# Patient Record
Sex: Female | Born: 1950
Health system: Southern US, Community
[De-identification: ages and names within clinical notes are randomized; demographics above are authoritative.]

## PROBLEM LIST (undated history)

## (undated) DIAGNOSIS — R011 Cardiac murmur, unspecified: Secondary | ICD-10-CM

## (undated) DIAGNOSIS — I10 Essential (primary) hypertension: Secondary | ICD-10-CM

## (undated) DIAGNOSIS — R600 Localized edema: Secondary | ICD-10-CM

## (undated) DIAGNOSIS — H269 Unspecified cataract: Secondary | ICD-10-CM

## (undated) DIAGNOSIS — E785 Hyperlipidemia, unspecified: Secondary | ICD-10-CM

## (undated) DIAGNOSIS — E119 Type 2 diabetes mellitus without complications: Secondary | ICD-10-CM

## (undated) HISTORY — PX: DILATION AND CURETTAGE OF UTERUS: SHX78

## (undated) HISTORY — DX: Unspecified cataract: H26.9

## (undated) HISTORY — DX: Localized edema: R60.0

## (undated) HISTORY — DX: Hyperlipidemia, unspecified: E78.5

## (undated) HISTORY — PX: COLONOSCOPY: SHX174

## (undated) HISTORY — PX: CATARACT EXTRACTION, BILATERAL: SHX1313

## (undated) HISTORY — DX: Cardiac murmur, unspecified: R01.1

---

## 1998-01-05 ENCOUNTER — Other Ambulatory Visit: Admission: RE | Admit: 1998-01-05 | Discharge: 1998-01-05 | Payer: Self-pay | Admitting: Obstetrics & Gynecology

## 1998-05-04 ENCOUNTER — Ambulatory Visit (HOSPITAL_COMMUNITY): Admission: RE | Admit: 1998-05-04 | Discharge: 1998-05-04 | Payer: Self-pay | Admitting: *Deleted

## 1999-01-18 ENCOUNTER — Other Ambulatory Visit: Admission: RE | Admit: 1999-01-18 | Discharge: 1999-01-18 | Payer: Self-pay | Admitting: Obstetrics & Gynecology

## 1999-08-02 ENCOUNTER — Ambulatory Visit (HOSPITAL_COMMUNITY): Admission: RE | Admit: 1999-08-02 | Discharge: 1999-08-02 | Payer: Self-pay | Admitting: *Deleted

## 2000-01-24 ENCOUNTER — Other Ambulatory Visit: Admission: RE | Admit: 2000-01-24 | Discharge: 2000-01-24 | Payer: Self-pay | Admitting: Obstetrics & Gynecology

## 2001-02-12 ENCOUNTER — Other Ambulatory Visit: Admission: RE | Admit: 2001-02-12 | Discharge: 2001-02-12 | Payer: Self-pay | Admitting: Obstetrics & Gynecology

## 2001-02-19 ENCOUNTER — Encounter: Payer: Self-pay | Admitting: Obstetrics & Gynecology

## 2001-02-19 ENCOUNTER — Ambulatory Visit (HOSPITAL_COMMUNITY): Admission: RE | Admit: 2001-02-19 | Discharge: 2001-02-19 | Payer: Self-pay | Admitting: Obstetrics & Gynecology

## 2002-02-18 ENCOUNTER — Other Ambulatory Visit: Admission: RE | Admit: 2002-02-18 | Discharge: 2002-02-18 | Payer: Self-pay | Admitting: Obstetrics & Gynecology

## 2004-01-19 ENCOUNTER — Other Ambulatory Visit: Admission: RE | Admit: 2004-01-19 | Discharge: 2004-01-19 | Payer: Self-pay | Admitting: Obstetrics & Gynecology

## 2005-02-19 ENCOUNTER — Other Ambulatory Visit: Admission: RE | Admit: 2005-02-19 | Discharge: 2005-02-19 | Payer: Self-pay | Admitting: Obstetrics & Gynecology

## 2006-05-03 ENCOUNTER — Ambulatory Visit: Payer: Self-pay | Admitting: Internal Medicine

## 2006-05-15 ENCOUNTER — Ambulatory Visit: Payer: Self-pay | Admitting: Internal Medicine

## 2013-10-15 ENCOUNTER — Other Ambulatory Visit: Payer: Self-pay | Admitting: Internal Medicine

## 2013-10-15 DIAGNOSIS — N289 Disorder of kidney and ureter, unspecified: Secondary | ICD-10-CM

## 2013-10-16 ENCOUNTER — Ambulatory Visit
Admission: RE | Admit: 2013-10-16 | Discharge: 2013-10-16 | Disposition: A | Discharge status: Home / Self Care | Payer: BC Managed Care – PPO | Source: Ambulatory Visit | Attending: Internal Medicine | Admitting: Internal Medicine

## 2013-10-16 DIAGNOSIS — N289 Disorder of kidney and ureter, unspecified: Secondary | ICD-10-CM

## 2015-08-31 MED FILL — FELODIPINE ER 10 MG TABLET: 10 | 30 days supply | Qty: 30 | Fill #10

## 2015-08-31 MED FILL — POTASSIUM CL ER 20 MEQ TAB: 20 | 60 days supply | Qty: 60 | Fill #6

## 2015-08-31 MED FILL — NOVOLOG MIX 70/30 VIAL: (70-30) 100 | 30 days supply | Qty: 40 | Fill #8

## 2015-08-31 MED FILL — GLYBURIDE-METFORMIN 2.5-500: 2.5-500 | 30 days supply | Qty: 120 | Fill #1

## 2015-09-26 MED FILL — NOVOLOG MIX 70/30 VIAL: (70-30) 100 | 30 days supply | Qty: 40 | Fill #9

## 2015-09-26 MED FILL — FELODIPINE ER 10 MG TABLET: 10 | 30 days supply | Qty: 30 | Fill #11

## 2015-09-26 MED FILL — GLYBURIDE-METFORMIN 2.5-500: 2.5-500 | 30 days supply | Qty: 120 | Fill #2

## 2015-09-26 MED FILL — LISINOPRIL 40 MG TABLET: 40 | 90 days supply | Qty: 90 | Fill #3

## 2015-10-28 MED FILL — NOVOLOG MIX 70/30 VIAL: (70-30) 100 | 30 days supply | Qty: 40 | Fill #0

## 2015-10-28 MED FILL — GLYBURIDE-METFORMIN 2.5-500: 2.5-500 | 30 days supply | Qty: 120 | Fill #3

## 2015-10-28 MED FILL — ONE TOUCH ULTRA TEST STRIPS: 90 days supply | Qty: 300 | Fill #1

## 2015-10-28 MED FILL — FELODIPINE ER 10 MG TABLET: 10 | 30 days supply | Qty: 30 | Fill #0

## 2015-11-28 DIAGNOSIS — E119 Type 2 diabetes mellitus without complications: Secondary | ICD-10-CM | POA: Diagnosis not present

## 2015-11-28 DIAGNOSIS — E784 Other hyperlipidemia: Secondary | ICD-10-CM | POA: Diagnosis not present

## 2015-11-28 DIAGNOSIS — I10 Essential (primary) hypertension: Secondary | ICD-10-CM | POA: Diagnosis not present

## 2015-11-29 MED FILL — FELODIPINE ER 10 MG TABLET: 10 | 30 days supply | Qty: 30 | Fill #1

## 2015-11-29 MED FILL — GLYBURIDE-METFORMIN 2.5-500: 2.5-500 | 30 days supply | Qty: 120 | Fill #4

## 2015-12-06 DIAGNOSIS — G5600 Carpal tunnel syndrome, unspecified upper limb: Secondary | ICD-10-CM | POA: Diagnosis not present

## 2015-12-06 DIAGNOSIS — Z0000 Encounter for general adult medical examination without abnormal findings: Secondary | ICD-10-CM | POA: Diagnosis not present

## 2015-12-06 DIAGNOSIS — K219 Gastro-esophageal reflux disease without esophagitis: Secondary | ICD-10-CM | POA: Diagnosis not present

## 2015-12-06 DIAGNOSIS — E1122 Type 2 diabetes mellitus with diabetic chronic kidney disease: Secondary | ICD-10-CM | POA: Diagnosis not present

## 2015-12-06 DIAGNOSIS — E784 Other hyperlipidemia: Secondary | ICD-10-CM | POA: Diagnosis not present

## 2015-12-06 DIAGNOSIS — M5380 Other specified dorsopathies, site unspecified: Secondary | ICD-10-CM | POA: Diagnosis not present

## 2015-12-06 DIAGNOSIS — N183 Chronic kidney disease, stage 3 (moderate): Secondary | ICD-10-CM | POA: Diagnosis not present

## 2015-12-06 DIAGNOSIS — I10 Essential (primary) hypertension: Secondary | ICD-10-CM | POA: Diagnosis not present

## 2015-12-06 DIAGNOSIS — R011 Cardiac murmur, unspecified: Secondary | ICD-10-CM | POA: Diagnosis not present

## 2015-12-06 MED FILL — CHANTIX STARTING MONTH BOX: 0.5 MG X 11 | 28 days supply | Qty: 53 | Fill #0

## 2015-12-07 DIAGNOSIS — Z1212 Encounter for screening for malignant neoplasm of rectum: Secondary | ICD-10-CM | POA: Diagnosis not present

## 2015-12-08 MED FILL — traMADol HCL 50 MG TABS: 50 | 17 days supply | Qty: 50 | Fill #0

## 2015-12-09 MED FILL — NOVOLOG MIX 70/30 VIAL: (70-30) 100 | 30 days supply | Qty: 40 | Fill #1

## 2015-12-09 MED FILL — ULTICARE SYR 1 ML 30GX5/16: 30G X 5/16" | 90 days supply | Qty: 200 | Fill #0

## 2015-12-26 MED FILL — FUROSEMIDE 80 MG TABLET: 80 | 90 days supply | Qty: 45 | Fill #0

## 2015-12-26 MED FILL — FELODIPINE ER 10 MG TABLET: 10 | 30 days supply | Qty: 30 | Fill #2

## 2015-12-26 MED FILL — LISINOPRIL 40 MG TABLET: 40 | 90 days supply | Qty: 90 | Fill #0

## 2015-12-26 MED FILL — SIMVASTATIN 80 MG TABLET: 80 | 90 days supply | Qty: 90 | Fill #0

## 2016-01-05 MED FILL — NOVOLOG MIX 70/30 VIAL: (70-30) 100 | 30 days supply | Qty: 40 | Fill #2

## 2016-01-05 MED FILL — CHANTIX 1 MG CONT MONTH BOX: 1 | 28 days supply | Qty: 56 | Fill #0

## 2016-01-26 MED FILL — FELODIPINE ER 10 MG TABLET: 10 | 30 days supply | Qty: 30 | Fill #3

## 2016-02-06 MED FILL — NOVOLOG MIX 70/30 VIAL: (70-30) 100 | 30 days supply | Qty: 40 | Fill #3

## 2016-02-06 MED FILL — GLYBURIDE-METFORMIN 2.5-500: 2.5-500 | 30 days supply | Qty: 120 | Fill #5

## 2016-02-06 MED FILL — CHANTIX 1 MG CONT MONTH BOX: 1 | 28 days supply | Qty: 56 | Fill #1

## 2016-02-06 MED FILL — KLOR-CON M20 TABLET: 20 | 60 days supply | Qty: 30 | Fill #0

## 2016-02-06 MED FILL — ONE TOUCH ULTRA TEST STRIPS: 90 days supply | Qty: 300 | Fill #2

## 2016-02-27 MED FILL — FELODIPINE ER 10 MG TABLET: 10 | 30 days supply | Qty: 30 | Fill #4

## 2016-03-07 MED FILL — CHANTIX 1 MG CONT MONTH BOX: 1 | 28 days supply | Qty: 56 | Fill #2

## 2016-03-07 MED FILL — NOVOLOG MIX 70/30 VIAL: (70-30) 100 | 30 days supply | Qty: 40 | Fill #4

## 2016-03-07 MED FILL — ULTICARE SYR 1 ML 30GX5/16: 30G X 5/16" | 90 days supply | Qty: 200 | Fill #1

## 2016-03-26 MED FILL — LISINOPRIL 40 MG TABLET: 40 | 90 days supply | Qty: 90 | Fill #1

## 2016-03-26 MED FILL — GLYBURIDE-METFORMIN 2.5-500: 2.5-500 | 30 days supply | Qty: 120 | Fill #6

## 2016-03-26 MED FILL — SIMVASTATIN 80 MG TABLET: 80 | 90 days supply | Qty: 90 | Fill #1

## 2016-03-26 MED FILL — FUROSEMIDE 80 MG TABLET: 80 | 90 days supply | Qty: 45 | Fill #1

## 2016-03-26 MED FILL — FELODIPINE ER 10 MG TABLET: 10 | 30 days supply | Qty: 30 | Fill #5

## 2016-03-30 MED FILL — CHANTIX 1 MG CONT MONTH BOX: 1 | 28 days supply | Qty: 56 | Fill #3

## 2016-04-16 DIAGNOSIS — I10 Essential (primary) hypertension: Secondary | ICD-10-CM | POA: Diagnosis not present

## 2016-04-16 DIAGNOSIS — R011 Cardiac murmur, unspecified: Secondary | ICD-10-CM | POA: Diagnosis not present

## 2016-04-16 DIAGNOSIS — E1122 Type 2 diabetes mellitus with diabetic chronic kidney disease: Secondary | ICD-10-CM | POA: Diagnosis not present

## 2016-04-16 DIAGNOSIS — Z6829 Body mass index (BMI) 29.0-29.9, adult: Secondary | ICD-10-CM | POA: Diagnosis not present

## 2016-04-16 DIAGNOSIS — N183 Chronic kidney disease, stage 3 (moderate): Secondary | ICD-10-CM | POA: Diagnosis not present

## 2016-04-16 DIAGNOSIS — F17200 Nicotine dependence, unspecified, uncomplicated: Secondary | ICD-10-CM | POA: Diagnosis not present

## 2016-04-16 DIAGNOSIS — E784 Other hyperlipidemia: Secondary | ICD-10-CM | POA: Diagnosis not present

## 2016-04-27 MED FILL — FELODIPINE ER 10 MG TABLET: 10 | 30 days supply | Qty: 30 | Fill #6

## 2016-04-27 MED FILL — GLYBURIDE-METFORMIN 2.5-500: 2.5-500 | 30 days supply | Qty: 120 | Fill #7

## 2016-04-27 MED FILL — NOVOLOG MIX 70/30 VIAL: (70-30) 100 | 30 days supply | Qty: 40 | Fill #5

## 2016-05-01 DIAGNOSIS — E119 Type 2 diabetes mellitus without complications: Secondary | ICD-10-CM | POA: Diagnosis not present

## 2016-05-01 DIAGNOSIS — H40013 Open angle with borderline findings, low risk, bilateral: Secondary | ICD-10-CM | POA: Diagnosis not present

## 2016-05-01 DIAGNOSIS — H26491 Other secondary cataract, right eye: Secondary | ICD-10-CM | POA: Diagnosis not present

## 2016-05-04 MED FILL — CHANTIX 1 MG CONT MONTH BOX: 1 | 28 days supply | Qty: 56 | Fill #4

## 2016-05-25 MED FILL — FELODIPINE ER 10 MG TABLET: 10 | 30 days supply | Qty: 30 | Fill #7

## 2016-05-25 MED FILL — GLYBURIDE-METFORMIN 2.5-500: 2.5-500 | 30 days supply | Qty: 120 | Fill #0

## 2016-06-06 ENCOUNTER — Encounter: Payer: Self-pay | Admitting: Internal Medicine

## 2016-06-15 MED FILL — ULTICARE SYR 1 ML 30GX5/16": 30G X 5/16" | 90 days supply | Qty: 200 | Fill #2

## 2016-06-15 MED FILL — TRIAMCINOLONE 0.1% CREAM: 0.1 | 20 days supply | Qty: 80 | Fill #1

## 2016-06-15 MED FILL — ONE TOUCH ULTRA TEST STRIPS: 90 days supply | Qty: 300 | Fill #3

## 2016-06-15 MED FILL — NOVOLOG MIX 70/30 VIAL: (70-30) 100 | 30 days supply | Qty: 40 | Fill #6

## 2016-06-15 MED FILL — ULTICARE SYR 1 ML 30GX5/16: 30G X 5/16" | 90 days supply | Qty: 200 | Fill #2

## 2016-06-25 MED FILL — FUROSEMIDE 80 MG TABLET: 80 | 90 days supply | Qty: 45 | Fill #2

## 2016-06-25 MED FILL — LISINOPRIL 40 MG TABLET: 40 | 90 days supply | Qty: 90 | Fill #2

## 2016-06-25 MED FILL — KLOR-CON M20 TABLET: 20 | 60 days supply | Qty: 30 | Fill #1

## 2016-06-25 MED FILL — FELODIPINE ER 10 MG TABLET: 10 | 30 days supply | Qty: 30 | Fill #8

## 2016-06-25 MED FILL — GLYBURIDE-METFORMIN 2.5-500: 2.5-500 | 30 days supply | Qty: 120 | Fill #1

## 2016-07-25 MED FILL — NOVOLOG MIX 70/30 VIAL: (70-30) 100 | 30 days supply | Qty: 40 | Fill #7

## 2016-07-25 MED FILL — SIMVASTATIN 80 MG TABLET: 80 | 90 days supply | Qty: 90 | Fill #2

## 2016-07-25 MED FILL — FELODIPINE ER 10 MG TABLET: 10 | 30 days supply | Qty: 30 | Fill #9

## 2016-07-25 MED FILL — GLYBURIDE-METFORMIN 2.5-500: 2.5-500 | 30 days supply | Qty: 120 | Fill #2

## 2016-08-15 DIAGNOSIS — E784 Other hyperlipidemia: Secondary | ICD-10-CM | POA: Diagnosis not present

## 2016-08-15 DIAGNOSIS — I10 Essential (primary) hypertension: Secondary | ICD-10-CM | POA: Diagnosis not present

## 2016-08-15 DIAGNOSIS — N183 Chronic kidney disease, stage 3 (moderate): Secondary | ICD-10-CM | POA: Diagnosis not present

## 2016-08-15 DIAGNOSIS — E1122 Type 2 diabetes mellitus with diabetic chronic kidney disease: Secondary | ICD-10-CM | POA: Diagnosis not present

## 2016-08-15 DIAGNOSIS — F17200 Nicotine dependence, unspecified, uncomplicated: Secondary | ICD-10-CM | POA: Diagnosis not present

## 2016-08-28 MED FILL — FELODIPINE ER 10 MG TABLET: 10 | 30 days supply | Qty: 30 | Fill #10

## 2016-09-21 MED FILL — FELODIPINE ER 10 MG TABLET: 10 | 30 days supply | Qty: 30 | Fill #11

## 2016-09-21 MED FILL — LISINOPRIL 40 MG TABLET: 40 | 90 days supply | Qty: 90 | Fill #3

## 2016-09-21 MED FILL — KLOR-CON M20 TABLET: 20 | 30 days supply | Qty: 30 | Fill #2

## 2016-09-21 MED FILL — ULTICARE SYR 1 ML 30GX5/16: 30G X 5/16" | 90 days supply | Qty: 200 | Fill #3

## 2016-09-21 MED FILL — ULTICARE SYR 1 ML 30GX5/16": 30G X 5/16" | 90 days supply | Qty: 200 | Fill #3

## 2016-09-21 MED FILL — FUROSEMIDE 80 MG TABLET: 80 | 90 days supply | Qty: 45 | Fill #0

## 2016-10-25 MED FILL — SIMVASTATIN 80 MG TABLET: 80 | 90 days supply | Qty: 90 | Fill #3

## 2016-10-25 MED FILL — FELODIPINE ER 10 MG TABLET: 10 | 30 days supply | Qty: 30 | Fill #0

## 2016-10-25 MED FILL — GLYBURIDE-METFORMIN 2.5-500: 2.5-500 | 30 days supply | Qty: 120 | Fill #3

## 2016-10-26 MED FILL — FREESTYLE LANCETS: 90 days supply | Qty: 300 | Fill #0

## 2016-10-26 MED FILL — FREESTYLE LITE METER: 30 days supply | Qty: 1 | Fill #0

## 2016-10-26 MED FILL — FREESTYLE LITE TEST STRIP: 90 days supply | Qty: 300 | Fill #0

## 2016-11-23 MED FILL — FELODIPINE ER 10 MG TABLET: 10 | 30 days supply | Qty: 30 | Fill #1

## 2016-11-23 MED FILL — KLOR-CON M20 TABLET: 20 | 30 days supply | Qty: 30 | Fill #3

## 2016-11-23 MED FILL — GLYBURIDE-METFORMIN 2.5-500: 2.5-500 | 30 days supply | Qty: 120 | Fill #4

## 2016-12-05 ENCOUNTER — Encounter: Payer: Self-pay | Admitting: Internal Medicine

## 2016-12-21 MED FILL — FELODIPINE ER 10 MG TABLET: 10 | 30 days supply | Qty: 30 | Fill #2

## 2016-12-21 MED FILL — POTASSIUM CL ER 20 MEQ TABL: 20 | 30 days supply | Qty: 30 | Fill #4

## 2016-12-21 MED FILL — GLYBURIDE-METFORMIN 2.5-500: 2.5-500 | 30 days supply | Qty: 120 | Fill #5

## 2016-12-21 MED FILL — LISINOPRIL 40 MG TABLET: 40 | 90 days supply | Qty: 90 | Fill #0

## 2016-12-21 MED FILL — FUROSEMIDE 80 MG TABLET: 80 | 90 days supply | Qty: 45 | Fill #1

## 2017-01-02 DIAGNOSIS — Z01419 Encounter for gynecological examination (general) (routine) without abnormal findings: Secondary | ICD-10-CM | POA: Diagnosis not present

## 2017-01-02 DIAGNOSIS — Z1231 Encounter for screening mammogram for malignant neoplasm of breast: Secondary | ICD-10-CM | POA: Diagnosis not present

## 2017-01-02 DIAGNOSIS — Z124 Encounter for screening for malignant neoplasm of cervix: Secondary | ICD-10-CM | POA: Diagnosis not present

## 2017-01-02 DIAGNOSIS — Z6839 Body mass index (BMI) 39.0-39.9, adult: Secondary | ICD-10-CM | POA: Diagnosis not present

## 2017-01-24 MED FILL — GLYBURIDE-METFORMIN 2.5-500: 2.5-500 | 30 days supply | Qty: 120 | Fill #6

## 2017-01-24 MED FILL — FREESTYLE LITE TEST STRIP: 90 days supply | Qty: 300 | Fill #1

## 2017-01-24 MED FILL — FELODIPINE ER 10 MG TABLET: 10 | 30 days supply | Qty: 30 | Fill #3

## 2017-01-30 ENCOUNTER — Ambulatory Visit (AMBULATORY_SURGERY_CENTER): Payer: Self-pay

## 2017-01-30 ENCOUNTER — Telehealth: Payer: Self-pay | Admitting: Internal Medicine

## 2017-01-30 VITALS — Ht 63.5 in | Wt 184.6 lb

## 2017-01-30 DIAGNOSIS — Z1211 Encounter for screening for malignant neoplasm of colon: Secondary | ICD-10-CM

## 2017-01-30 MED ORDER — BISACODYL 5 MG PO TBEC: 5.0000 mg | DELAYED_RELEASE_TABLET | Freq: Once | ORAL | 0 refills | Status: AC

## 2017-01-30 MED ORDER — POLYETHYLENE GLYCOL 3350 17 GM/SCOOP PO POWD: 1.0000 | Freq: Once | ORAL | 0 refills | Status: AC

## 2017-01-30 MED FILL — POLYETHYLENE GLYCOL 3350: 1 days supply | Qty: 255 | Fill #0

## 2017-01-30 NOTE — Telephone Encounter (Signed)
Called pharmacy and clarified pt wanted OTC prep Jakwan Sally/PV

## 2017-01-30 NOTE — Progress Notes (Signed)
No allergies to eggs or soy No past problems with anesthesia No diet meds No home oxygen  Declined emmi 

## 2017-02-13 ENCOUNTER — Encounter: Payer: Self-pay | Admitting: Internal Medicine

## 2017-02-13 ENCOUNTER — Ambulatory Visit (AMBULATORY_SURGERY_CENTER): Payer: 59 | Admitting: Internal Medicine

## 2017-02-13 VITALS — BP 152/70 | HR 50 | Temp 96.6°F | Resp 18 | Ht 63.5 in | Wt 184.0 lb

## 2017-02-13 DIAGNOSIS — Z1212 Encounter for screening for malignant neoplasm of rectum: Secondary | ICD-10-CM

## 2017-02-13 DIAGNOSIS — Z1211 Encounter for screening for malignant neoplasm of colon: Secondary | ICD-10-CM

## 2017-02-13 MED ORDER — SODIUM CHLORIDE 0.9 % IV SOLN: 500.0000 mL | INTRAVENOUS | Status: AC

## 2017-02-13 NOTE — Patient Instructions (Addendum)
No polyps or cancer seen. Next routine colonoscopy or other screening test in 10 years - 2028  You do have diverticulosis - thickened muscle rings and pouches in the colon wall. Please read the handout about this condition.  I appreciate the opportunity to care for you. Robin Duran E. Bobby Ragan, MD, FACG  YOU HAD AN ENDOSCOPIC PROCEDURE TODAY AT THE  ENDOSCOPY CENTER:   Refer to the procedure report that was given to you for any specific questions about what was found during the examination.  If the procedure report does not answer your questions, please call your gastroenterologist to clarify.  If you requested that your care partner not be given the details of your procedure findings, then the procedure report has been included in a sealed envelope for you to review at your convenience later.  YOU SHOULD EXPECT: Some feelings of bloating in the abdomen. Passage of more gas than usual.  Walking can help get rid of the air that was put into your GI tract during the procedure and reduce the bloating. If you had a lower endoscopy (such as a colonoscopy or flexible sigmoidoscopy) you may notice spotting of blood in your stool or on the toilet paper. If you underwent a bowel prep for your procedure, you may not have a normal bowel movement for a few days.  Please Note:  You might notice some irritation and congestion in your nose or some drainage.  This is from the oxygen used during your procedure.  There is no need for concern and it should clear up in a day or so.  SYMPTOMS TO REPORT IMMEDIATELY:   Following lower endoscopy (colonoscopy or flexible sigmoidoscopy):  Excessive amounts of blood in the stool  Significant tenderness or worsening of abdominal pains  Swelling of the abdomen that is new, acute  Fever of 100F or higher  For urgent or emergent issues, a gastroenterologist can be reached at any hour by calling [phone].   DIET:  We do recommend a small meal at first, but  then you may proceed to your regular diet.  Drink plenty of fluids but you should avoid alcoholic beverages for 24 hours.  MEDICATIONS: Continue present medications.  Please see handouts given to you by your recovery nurse.  ACTIVITY:  You should plan to take it easy for the rest of today and you should NOT DRIVE or use heavy machinery until tomorrow (because of the sedation medicines used during the test).    FOLLOW UP: Our staff will call the number listed on your records the next business day following your procedure to check on you and address any questions or concerns that you may have regarding the information given to you following your procedure. If we do not reach you, we will leave a message.  However, if you are feeling well and you are not experiencing any problems, there is no need to return our call.  We will assume that you have returned to your regular daily activities without incident.  If any biopsies were taken you will be contacted by phone or by letter within the next 1-3 weeks.  Please call us at [phone] if you have not heard about the biopsies in 3 weeks.   Thank you for allowing us to provide for your healthcare needs today.  SIGNATURES/CONFIDENTIALITY: You and/or your care partner have signed paperwork which will be entered into your electronic medical record.  These signatures attest to the fact that that the information above on  your After Visit Summary has been reviewed and is understood.  Full responsibility of the confidentiality of this discharge information lies with you and/or your care-partner. 

## 2017-02-13 NOTE — Progress Notes (Signed)
Alert and oriented x3, pleased with MAC, report to RN Judson Roch

## 2017-02-13 NOTE — Op Note (Signed)
[facility] Patient Name: Robin Duran Procedure Date: 02/13/2017 9:00 AM MRN: [id number] Endoscopist: Iva Boop , MD Age: 66 Referring MD:  Date of Birth: 1951-05-09 Gender: Female Account #: [id number] Procedure:                Colonoscopy Indications:              Screening for colorectal malignant neoplasm Medicines:                Propofol per Anesthesia, Monitored Anesthesia Care Procedure:                Pre-Anesthesia Assessment:                           - Prior to the procedure, a History and Physical                            was performed, and patient medications and                            allergies were reviewed. The patient's tolerance of                            previous anesthesia was also reviewed. The risks                            and benefits of the procedure and the sedation                            options and risks were discussed with the patient.                            All questions were answered, and informed consent                            was obtained. Prior Anticoagulants: The patient has                            taken no previous anticoagulant or antiplatelet                            agents. ASA Grade Assessment: II - A patient with                            mild systemic disease. After reviewing the risks                            and benefits, the patient was deemed in                            satisfactory condition to undergo the procedure.                           After obtaining informed consent, the colonoscope  was passed under direct vision. Throughout the                            procedure, the patient's blood pressure, pulse, and                            oxygen saturations were monitored continuously. The                            Colonoscope was introduced through the anus and                            advanced to the the cecum, identified by   appendiceal orifice and ileocecal valve. The                            colonoscopy was performed without difficulty. The                            patient tolerated the procedure well. The quality                            of the bowel preparation was good. The ileocecal                            valve, appendiceal orifice, and rectum were                            photographed. Scope In: 9:06:45 AM Scope Out: 9:22:08 AM Scope Withdrawal Time: 0 hours 11 minutes 58 seconds  Total Procedure Duration: 0 hours 15 minutes 23 seconds  Findings:                 The perianal and digital rectal examinations were                            normal.                           Multiple small and large-mouthed diverticula were                            found in the sigmoid colon. There was no evidence                            of diverticular bleeding.                           The exam was otherwise without abnormality on                            direct and retroflexion views. Complications:            No immediate complications. Estimated Blood Loss:     Estimated blood loss: none. Impression:               - Diverticulosis in the  sigmoid colon. There was no                            evidence of diverticular bleeding.                           - The examination was otherwise normal on direct                            and retroflexion views.                           - No specimens collected. Recommendation:           - Patient has a contact number available for                            emergencies. The signs and symptoms of potential                            delayed complications were discussed with the                            patient. Return to normal activities tomorrow.                            Written discharge instructions were provided to the                            patient.                           - Resume previous diet.                           - Continue present  medications.                           - Repeat colonoscopy in 10 years for screening                            purposes. Iva Booparl E Savayah Waltrip, MD 02/13/2017 9:33:34 AM This report has been signed electronically.

## 2017-02-14 ENCOUNTER — Telehealth: Payer: Self-pay | Admitting: *Deleted

## 2017-02-14 NOTE — Telephone Encounter (Signed)
  Follow up Call-  Call back number 02/13/2017  Post procedure Call Back phone  # [phone]  Permission to leave phone message Yes  Some recent data might be hidden     Patient questions:  Do you have a fever, pain , or abdominal swelling? No. Pain Score  0 *  Have you tolerated food without any problems? Yes.    Have you been able to return to your normal activities? Yes.    Do you have any questions about your discharge instructions: Diet   No. Medications  No. Follow up visit  No.  Do you have questions or concerns about your Care? No.  Actions: * If pain score is 4 or above: No action needed, pain <4.

## 2017-02-18 DIAGNOSIS — E1122 Type 2 diabetes mellitus with diabetic chronic kidney disease: Secondary | ICD-10-CM | POA: Diagnosis not present

## 2017-02-18 DIAGNOSIS — I10 Essential (primary) hypertension: Secondary | ICD-10-CM | POA: Diagnosis not present

## 2017-02-22 MED FILL — FELODIPINE ER 10 MG TABLET: 10 | 30 days supply | Qty: 30 | Fill #4

## 2017-02-22 MED FILL — GLYBURIDE-METFORMIN 2.5-500: 2.5-500 | 30 days supply | Qty: 120 | Fill #7

## 2017-02-22 MED FILL — POTASSIUM CL ER 20 MEQ TABL: 20 | 30 days supply | Qty: 30 | Fill #0

## 2017-03-04 DIAGNOSIS — Z780 Asymptomatic menopausal state: Secondary | ICD-10-CM | POA: Diagnosis not present

## 2017-03-04 DIAGNOSIS — K219 Gastro-esophageal reflux disease without esophagitis: Secondary | ICD-10-CM | POA: Diagnosis not present

## 2017-03-04 DIAGNOSIS — I10 Essential (primary) hypertension: Secondary | ICD-10-CM | POA: Diagnosis not present

## 2017-03-04 DIAGNOSIS — R011 Cardiac murmur, unspecified: Secondary | ICD-10-CM | POA: Diagnosis not present

## 2017-03-04 DIAGNOSIS — F17200 Nicotine dependence, unspecified, uncomplicated: Secondary | ICD-10-CM | POA: Diagnosis not present

## 2017-03-04 DIAGNOSIS — Z23 Encounter for immunization: Secondary | ICD-10-CM | POA: Diagnosis not present

## 2017-03-04 DIAGNOSIS — E784 Other hyperlipidemia: Secondary | ICD-10-CM | POA: Diagnosis not present

## 2017-03-04 DIAGNOSIS — Z0000 Encounter for general adult medical examination without abnormal findings: Secondary | ICD-10-CM | POA: Diagnosis not present

## 2017-03-04 DIAGNOSIS — E1122 Type 2 diabetes mellitus with diabetic chronic kidney disease: Secondary | ICD-10-CM | POA: Diagnosis not present

## 2017-03-04 DIAGNOSIS — N183 Chronic kidney disease, stage 3 (moderate): Secondary | ICD-10-CM | POA: Diagnosis not present

## 2017-03-04 DIAGNOSIS — G5600 Carpal tunnel syndrome, unspecified upper limb: Secondary | ICD-10-CM | POA: Diagnosis not present

## 2017-03-04 DIAGNOSIS — Z1389 Encounter for screening for other disorder: Secondary | ICD-10-CM | POA: Diagnosis not present

## 2017-03-18 MED FILL — FUROSEMIDE 80 MG TABLET: 80 | 90 days supply | Qty: 45 | Fill #2

## 2017-03-18 MED FILL — FELODIPINE ER 10 MG TABLET: 10 | 30 days supply | Qty: 30 | Fill #5

## 2017-03-18 MED FILL — LISINOPRIL 40 MG TAB: 40 | 90 days supply | Qty: 90 | Fill #1

## 2017-03-18 MED FILL — SIMVASTATIN 80 MG TABLET: 80 | 30 days supply | Qty: 30 | Fill #0

## 2017-04-24 MED FILL — FREESTYLE LITE TEST STRIP: 90 days supply | Qty: 300 | Fill #2

## 2017-04-24 MED FILL — GLYBURIDE-METFORMIN 2.5-500: 2.5-500 | 30 days supply | Qty: 120 | Fill #8

## 2017-04-24 MED FILL — FELODIPINE ER 10 MG TABLET: 10 | 30 days supply | Qty: 30 | Fill #6

## 2017-04-24 MED FILL — POTASSIUM CL ER 20 MEQ TABL: 20 | 30 days supply | Qty: 30 | Fill #1

## 2017-05-21 MED FILL — GLYBURIDE-METFORMIN 2.5-500: 2.5-500 | 30 days supply | Qty: 120 | Fill #9

## 2017-05-21 MED FILL — POTASSIUM CL ER 20 MEQ TABL: 20 | 30 days supply | Qty: 30 | Fill #2

## 2017-05-22 MED FILL — FELODIPINE ER 10 MG TABLET: 10 | 30 days supply | Qty: 30 | Fill #7

## 2017-06-18 MED FILL — SIMVASTATIN 80 MG TABLET: 80 | 30 days supply | Qty: 30 | Fill #1

## 2017-06-18 MED FILL — LISINOPRIL 40 MG TABLET: 40 | 90 days supply | Qty: 90 | Fill #2

## 2017-06-18 MED FILL — POTASSIUM CL ER 20 MEQ TABL: 20 | 30 days supply | Qty: 30 | Fill #3

## 2017-06-19 MED FILL — GLYBURIDE-METFORMIN 2.5-500: 2.5-500 | 30 days supply | Qty: 120 | Fill #0

## 2017-06-19 MED FILL — FUROSEMIDE 80 MG TABLET: 80 | 90 days supply | Qty: 45 | Fill #0

## 2017-06-24 DIAGNOSIS — I10 Essential (primary) hypertension: Secondary | ICD-10-CM | POA: Diagnosis not present

## 2017-06-24 DIAGNOSIS — E1122 Type 2 diabetes mellitus with diabetic chronic kidney disease: Secondary | ICD-10-CM | POA: Diagnosis not present

## 2017-06-24 DIAGNOSIS — F17200 Nicotine dependence, unspecified, uncomplicated: Secondary | ICD-10-CM | POA: Diagnosis not present

## 2017-06-24 DIAGNOSIS — Z6831 Body mass index (BMI) 31.0-31.9, adult: Secondary | ICD-10-CM | POA: Diagnosis not present

## 2017-06-24 DIAGNOSIS — D649 Anemia, unspecified: Secondary | ICD-10-CM | POA: Diagnosis not present

## 2017-06-24 DIAGNOSIS — N183 Chronic kidney disease, stage 3 (moderate): Secondary | ICD-10-CM | POA: Diagnosis not present

## 2017-06-25 MED FILL — FELODIPINE ER 10 MG TABLET: 10 | 30 days supply | Qty: 30 | Fill #8

## 2017-06-28 MED FILL — TOUJEO SOLOSTAR 300 UNITS/M: 300 | 90 days supply | Qty: 27 | Fill #0

## 2017-07-02 MED FILL — UNIFINE PENTIPS 8MM 31G: 31G X 8 MM | 90 days supply | Qty: 100 | Fill #0

## 2017-07-24 MED FILL — FELODIPINE ER 10 MG TABLET: 10 | 30 days supply | Qty: 30 | Fill #9

## 2017-07-24 MED FILL — POTASSIUM CL ER 20 MEQ TABL: 20 | 30 days supply | Qty: 30 | Fill #4

## 2017-07-24 MED FILL — SIMVASTATIN 80 MG TABLET: 80 | 30 days supply | Qty: 30 | Fill #2

## 2017-07-24 MED FILL — GLYBURIDE-METFORMIN 2.5-500: 2.5-500 | 30 days supply | Qty: 120 | Fill #1

## 2017-08-21 MED FILL — FREESTYLE LITE TEST STRIP: 90 days supply | Qty: 300 | Fill #3

## 2017-08-21 MED FILL — FELODIPINE ER 10 MG TABLET: 10 | 30 days supply | Qty: 30 | Fill #10

## 2017-08-21 MED FILL — SIMVASTATIN 80 MG TABLET: 80 | 90 days supply | Qty: 90 | Fill #3

## 2017-08-21 MED FILL — GLYBURIDE-METFORMIN 2.5-500: 2.5-500 | 30 days supply | Qty: 120 | Fill #2

## 2017-08-21 MED FILL — POTASSIUM CL ER 20 MEQ TABL: 20 | 30 days supply | Qty: 30 | Fill #5

## 2017-09-11 MED FILL — LISINOPRIL 40 MG TABLET: 40 | 90 days supply | Qty: 90 | Fill #3

## 2017-09-11 MED FILL — FUROSEMIDE 80 MG TABLET: 80 | 90 days supply | Qty: 45 | Fill #1

## 2017-09-24 MED FILL — TOUJEO SOLOSTAR 300 UNITS/M: 300 | 90 days supply | Qty: 27 | Fill #1

## 2017-09-24 MED FILL — FELODIPINE ER 10 MG TABLET: 10 | 30 days supply | Qty: 30 | Fill #11

## 2017-09-24 MED FILL — GLYBURIDE-METFORMIN 2.5-500: 2.5-500 | 30 days supply | Qty: 120 | Fill #3

## 2017-09-24 MED FILL — POTASSIUM CL ER 20 MEQ TABL: 20 | 30 days supply | Qty: 30 | Fill #6

## 2017-10-21 MED FILL — FELODIPINE ER 10 MG TABLET: 10 | 30 days supply | Qty: 30 | Fill #0

## 2017-10-21 MED FILL — GLYBURIDE-METFORMIN 2.5-500: 2.5-500 | 30 days supply | Qty: 120 | Fill #4

## 2017-10-21 MED FILL — POTASSIUM CL ER 20 MEQ TABL: 20 | 30 days supply | Qty: 30 | Fill #7

## 2017-11-18 DIAGNOSIS — Z6831 Body mass index (BMI) 31.0-31.9, adult: Secondary | ICD-10-CM | POA: Diagnosis not present

## 2017-11-18 DIAGNOSIS — E1122 Type 2 diabetes mellitus with diabetic chronic kidney disease: Secondary | ICD-10-CM | POA: Diagnosis not present

## 2017-11-18 DIAGNOSIS — Z1389 Encounter for screening for other disorder: Secondary | ICD-10-CM | POA: Diagnosis not present

## 2017-11-18 DIAGNOSIS — I10 Essential (primary) hypertension: Secondary | ICD-10-CM | POA: Diagnosis not present

## 2017-11-18 DIAGNOSIS — N183 Chronic kidney disease, stage 3 (moderate): Secondary | ICD-10-CM | POA: Diagnosis not present

## 2017-11-18 DIAGNOSIS — M79671 Pain in right foot: Secondary | ICD-10-CM | POA: Diagnosis not present

## 2017-11-18 DIAGNOSIS — E1165 Type 2 diabetes mellitus with hyperglycemia: Secondary | ICD-10-CM | POA: Diagnosis not present

## 2017-11-18 MED FILL — HUMALOG 100 UNITS/ML KWIKPE: 100 | 30 days supply | Qty: 9 | Fill #0

## 2017-11-19 MED FILL — FELODIPINE ER 10 MG TABLET: 10 | 30 days supply | Qty: 30 | Fill #1

## 2017-12-10 MED FILL — GLYBURIDE-METFORMIN 2.5-500: 2.5-500 | 30 days supply | Qty: 120 | Fill #5

## 2017-12-10 MED FILL — POTASSIUM CL ER 20 MEQ TABL: 20 | 30 days supply | Qty: 30 | Fill #8

## 2017-12-10 MED FILL — LISINOPRIL 40 MG TABLET: 40 | 30 days supply | Qty: 30 | Fill #0

## 2017-12-10 MED FILL — FUROSEMIDE 80 MG TABLET: 80 | 90 days supply | Qty: 45 | Fill #2

## 2017-12-11 MED FILL — FREESTYLE LITE TEST STRIP: 30 days supply | Qty: 100 | Fill #0

## 2017-12-17 DIAGNOSIS — E119 Type 2 diabetes mellitus without complications: Secondary | ICD-10-CM | POA: Diagnosis not present

## 2017-12-26 MED FILL — HUMALOG 100 UNITS/ML KWIKPE: 100 | 30 days supply | Qty: 9 | Fill #1

## 2017-12-26 MED FILL — FELODIPINE ER 10 MG TABLET: 10 | 30 days supply | Qty: 30 | Fill #2

## 2017-12-26 MED FILL — TOUJEO SOLOSTAR 300 UNITS/M: 300 | 90 days supply | Qty: 27 | Fill #2

## 2018-01-14 MED FILL — FREESTYLE LITE TEST STRIP: 30 days supply | Qty: 100 | Fill #0

## 2018-01-14 MED FILL — POTASSIUM CL ER 20 MEQ TABL: 20 | 30 days supply | Qty: 30 | Fill #9

## 2018-01-14 MED FILL — GLYBURIDE-METFORMIN 2.5-500: 2.5-500 | 30 days supply | Qty: 120 | Fill #6

## 2018-01-14 MED FILL — LISINOPRIL 40 MG TABLET: 40 | 30 days supply | Qty: 30 | Fill #0

## 2018-01-17 DIAGNOSIS — E1165 Type 2 diabetes mellitus with hyperglycemia: Secondary | ICD-10-CM | POA: Diagnosis not present

## 2018-01-17 DIAGNOSIS — N183 Chronic kidney disease, stage 3 (moderate): Secondary | ICD-10-CM | POA: Diagnosis not present

## 2018-01-17 DIAGNOSIS — I10 Essential (primary) hypertension: Secondary | ICD-10-CM | POA: Diagnosis not present

## 2018-01-23 MED FILL — FELODIPINE ER 10 MG TABLET: 10 | 30 days supply | Qty: 30 | Fill #3

## 2018-02-12 MED FILL — POTASSIUM CL ER 20 MEQ TABL: 20 | 30 days supply | Qty: 30 | Fill #10

## 2018-02-12 MED FILL — GLYBURIDE-METFORMIN 2.5-500: 2.5-500 | 30 days supply | Qty: 120 | Fill #7

## 2018-02-12 MED FILL — LISINOPRIL 40 MG TABLET: 40 | 30 days supply | Qty: 30 | Fill #1

## 2018-02-21 MED FILL — FELODIPINE ER 10 MG TABLET: 10 | 30 days supply | Qty: 30 | Fill #4

## 2018-03-06 MED FILL — FUROSEMIDE 80 MG TABLET: 80 | 90 days supply | Qty: 45 | Fill #0

## 2018-03-06 MED FILL — FREESTYLE LANCETS: 90 days supply | Qty: 300 | Fill #0

## 2018-03-06 MED FILL — FREESTYLE LITE TEST STRIP: 33 days supply | Qty: 100 | Fill #0

## 2018-03-06 MED FILL — HUMALOG 100 UNITS/ML KWIKPE: 100 | 30 days supply | Qty: 9 | Fill #2

## 2018-03-10 MED FILL — POTASSIUM CL ER 20 MEQ TAB: 20 | 30 days supply | Qty: 30 | Fill #0

## 2018-03-13 MED FILL — LISINOPRIL 40 MG TABLET: 40 | 30 days supply | Qty: 30 | Fill #2

## 2018-03-13 MED FILL — GLYBURIDE-METFORMIN 2.5-500: 2.5-500 | 30 days supply | Qty: 120 | Fill #8

## 2018-03-24 DIAGNOSIS — E1122 Type 2 diabetes mellitus with diabetic chronic kidney disease: Secondary | ICD-10-CM | POA: Diagnosis not present

## 2018-03-24 DIAGNOSIS — R82998 Other abnormal findings in urine: Secondary | ICD-10-CM | POA: Diagnosis not present

## 2018-03-24 DIAGNOSIS — I10 Essential (primary) hypertension: Secondary | ICD-10-CM | POA: Diagnosis not present

## 2018-03-25 MED FILL — FELODIPINE ER 10 MG TABLET: 10 | 30 days supply | Qty: 30 | Fill #5

## 2018-03-31 DIAGNOSIS — N183 Chronic kidney disease, stage 3 (moderate): Secondary | ICD-10-CM | POA: Diagnosis not present

## 2018-03-31 DIAGNOSIS — Z1389 Encounter for screening for other disorder: Secondary | ICD-10-CM | POA: Diagnosis not present

## 2018-03-31 DIAGNOSIS — I10 Essential (primary) hypertension: Secondary | ICD-10-CM | POA: Diagnosis not present

## 2018-03-31 DIAGNOSIS — Z0000 Encounter for general adult medical examination without abnormal findings: Secondary | ICD-10-CM | POA: Diagnosis not present

## 2018-03-31 DIAGNOSIS — M5380 Other specified dorsopathies, site unspecified: Secondary | ICD-10-CM | POA: Diagnosis not present

## 2018-03-31 DIAGNOSIS — R011 Cardiac murmur, unspecified: Secondary | ICD-10-CM | POA: Diagnosis not present

## 2018-03-31 DIAGNOSIS — K219 Gastro-esophageal reflux disease without esophagitis: Secondary | ICD-10-CM | POA: Diagnosis not present

## 2018-03-31 DIAGNOSIS — D649 Anemia, unspecified: Secondary | ICD-10-CM | POA: Diagnosis not present

## 2018-03-31 DIAGNOSIS — E1122 Type 2 diabetes mellitus with diabetic chronic kidney disease: Secondary | ICD-10-CM | POA: Diagnosis not present

## 2018-03-31 DIAGNOSIS — E7849 Other hyperlipidemia: Secondary | ICD-10-CM | POA: Diagnosis not present

## 2018-03-31 MED FILL — HUMALOG MIX 75-25 KWIKPEN: (75-25) 100 | 28 days supply | Qty: 30 | Fill #0

## 2018-04-09 MED FILL — LISINOPRIL 40 MG TABLET: 40 | 30 days supply | Qty: 30 | Fill #3

## 2018-04-24 MED FILL — POTASSIUM CL ER 20 MEQ TAB: 20 | 30 days supply | Qty: 30 | Fill #1

## 2018-04-24 MED FILL — FELODIPINE ER 10 MG TABLET: 10 | 30 days supply | Qty: 30 | Fill #6

## 2018-04-24 MED FILL — GLYBURIDE-METFORMIN 2.5-500: 2.5-500 | 30 days supply | Qty: 120 | Fill #9

## 2018-05-06 MED FILL — FREESTYLE LITE TEST STRIP: 33 days supply | Qty: 100 | Fill #1

## 2018-05-06 MED FILL — HUMALOG MIX 75-25 KWIKPEN: (75-25) 100 | 30 days supply | Qty: 39 | Fill #1

## 2018-05-15 MED FILL — LISINOPRIL 40 MG TABLET: 40 | 30 days supply | Qty: 30 | Fill #4

## 2018-05-22 MED FILL — GLYBURIDE-METFORMIN 2.5-500: 2.5-500 | 30 days supply | Qty: 120 | Fill #10

## 2018-05-22 MED FILL — FELODIPINE ER 10 MG TABLET: 10 | 90 days supply | Qty: 90 | Fill #0

## 2018-06-11 MED FILL — FREESTYLE LITE TEST STRIP: 33 days supply | Qty: 100 | Fill #2

## 2018-06-11 MED FILL — FUROSEMIDE 80 MG TABLET: 80 | 90 days supply | Qty: 45 | Fill #1

## 2018-06-11 MED FILL — HUMALOG MIX 75-25 KWIKPEN: (75-25) 100 | 30 days supply | Qty: 39 | Fill #2

## 2018-06-11 MED FILL — LISINOPRIL 40 MG TABLET: 40 | 90 days supply | Qty: 90 | Fill #0

## 2018-06-11 MED FILL — POTASSIUM CL ER 20 MEQ TAB: 20 | 30 days supply | Qty: 30 | Fill #2

## 2018-06-24 MED FILL — CEPHALEXIN 500 MG CAPSULE: 500 | 7 days supply | Qty: 21 | Fill #0

## 2018-07-03 DIAGNOSIS — E1165 Type 2 diabetes mellitus with hyperglycemia: Secondary | ICD-10-CM | POA: Diagnosis not present

## 2018-07-03 DIAGNOSIS — Z6830 Body mass index (BMI) 30.0-30.9, adult: Secondary | ICD-10-CM | POA: Diagnosis not present

## 2018-07-03 DIAGNOSIS — L299 Pruritus, unspecified: Secondary | ICD-10-CM | POA: Diagnosis not present

## 2018-07-03 DIAGNOSIS — E1122 Type 2 diabetes mellitus with diabetic chronic kidney disease: Secondary | ICD-10-CM | POA: Diagnosis not present

## 2018-07-03 DIAGNOSIS — R21 Rash and other nonspecific skin eruption: Secondary | ICD-10-CM | POA: Diagnosis not present

## 2018-07-23 MED FILL — GLYBURIDE-METFORMIN 2.5-500: 2.5-500 | 90 days supply | Qty: 360 | Fill #0

## 2018-07-23 MED FILL — HUMALOG MIX 75-25 KWIKPEN: (75-25) 100 | 30 days supply | Qty: 39 | Fill #3

## 2018-07-23 MED FILL — POTASSIUM CHLORIDE CRYS ER: 20 | 30 days supply | Qty: 30 | Fill #3

## 2018-07-23 MED FILL — FREESTYLE LITE TEST STRIP: 33 days supply | Qty: 100 | Fill #3

## 2018-08-08 DIAGNOSIS — F17200 Nicotine dependence, unspecified, uncomplicated: Secondary | ICD-10-CM | POA: Diagnosis not present

## 2018-08-08 DIAGNOSIS — Z6830 Body mass index (BMI) 30.0-30.9, adult: Secondary | ICD-10-CM | POA: Diagnosis not present

## 2018-08-08 DIAGNOSIS — N183 Chronic kidney disease, stage 3 (moderate): Secondary | ICD-10-CM | POA: Diagnosis not present

## 2018-08-08 DIAGNOSIS — D649 Anemia, unspecified: Secondary | ICD-10-CM | POA: Diagnosis not present

## 2018-08-08 DIAGNOSIS — E1122 Type 2 diabetes mellitus with diabetic chronic kidney disease: Secondary | ICD-10-CM | POA: Diagnosis not present

## 2018-08-08 DIAGNOSIS — I10 Essential (primary) hypertension: Secondary | ICD-10-CM | POA: Diagnosis not present

## 2018-08-08 DIAGNOSIS — M1990 Unspecified osteoarthritis, unspecified site: Secondary | ICD-10-CM | POA: Diagnosis not present

## 2018-08-14 MED FILL — DICLOFENAC SODIUM 1 % GEL: 1 | 7 days supply | Qty: 100 | Fill #0

## 2018-08-21 MED FILL — FELODIPINE ER 10 MG TABLET: 10 | 90 days supply | Qty: 90 | Fill #1

## 2018-08-21 MED FILL — HUMALOG MIX 75-25 KWIKPEN: (75-25) 100 | 30 days supply | Qty: 39 | Fill #4

## 2018-09-09 MED FILL — POTASSIUM CHLORIDE CRYS ER: 20 | 30 days supply | Qty: 30 | Fill #4

## 2018-09-09 MED FILL — FUROSEMIDE 80 MG TABLET: 80 | 90 days supply | Qty: 45 | Fill #0

## 2018-09-09 MED FILL — FREESTYLE LITE TEST STRIP: 33 days supply | Qty: 100 | Fill #0

## 2018-09-09 MED FILL — LISINOPRIL 40 MG TABLET: 40 | 90 days supply | Qty: 90 | Fill #1

## 2018-10-07 MED FILL — HUMALOG MIX 75-25 KWIKPEN: (75-25) 100 | 30 days supply | Qty: 39 | Fill #5

## 2018-10-07 MED FILL — POTASSIUM CHLORIDE CRYS ER: 20 | 30 days supply | Qty: 30 | Fill #0

## 2018-10-07 MED FILL — UNIFINE PENTIPS 8MM 31G: 31G X 8 MM | 90 days supply | Qty: 300 | Fill #0

## 2018-10-07 MED FILL — FREESTYLE LITE TEST STRIP: 33 days supply | Qty: 100 | Fill #1

## 2018-11-12 MED FILL — HUMALOG MIX 75-25 KWIKPEN: (75-25) 100 | 30 days supply | Qty: 39 | Fill #6 | Status: TO

## 2018-11-12 MED FILL — TRIAMCINOLONE 0.1% CREAM: 0.1 | 10 days supply | Qty: 80 | Fill #0

## 2018-11-12 MED FILL — FREESTYLE LITE TEST STRIP: 33 days supply | Qty: 100 | Fill #2 | Status: TO

## 2018-11-12 MED FILL — FELODIPINE ER 10 MG TABLET: 10 | 90 days supply | Qty: 90 | Fill #0

## 2018-11-12 MED FILL — GLYBURIDE-METFORMIN 2.5-500: 2.5-500 | 90 days supply | Qty: 360 | Fill #1

## 2018-11-12 MED FILL — POTASSIUM CHLORIDE CRYS ER: 20 | 30 days supply | Qty: 30 | Fill #1 | Status: TO

## 2018-12-09 DIAGNOSIS — F17200 Nicotine dependence, unspecified, uncomplicated: Secondary | ICD-10-CM | POA: Diagnosis not present

## 2018-12-09 DIAGNOSIS — Z1331 Encounter for screening for depression: Secondary | ICD-10-CM | POA: Diagnosis not present

## 2018-12-09 DIAGNOSIS — D649 Anemia, unspecified: Secondary | ICD-10-CM | POA: Diagnosis not present

## 2018-12-09 DIAGNOSIS — N183 Chronic kidney disease, stage 3 (moderate): Secondary | ICD-10-CM | POA: Diagnosis not present

## 2018-12-09 DIAGNOSIS — E1165 Type 2 diabetes mellitus with hyperglycemia: Secondary | ICD-10-CM | POA: Diagnosis not present

## 2018-12-09 DIAGNOSIS — I129 Hypertensive chronic kidney disease with stage 1 through stage 4 chronic kidney disease, or unspecified chronic kidney disease: Secondary | ICD-10-CM | POA: Diagnosis not present

## 2018-12-09 DIAGNOSIS — E785 Hyperlipidemia, unspecified: Secondary | ICD-10-CM | POA: Diagnosis not present

## 2018-12-09 DIAGNOSIS — R252 Cramp and spasm: Secondary | ICD-10-CM | POA: Diagnosis not present

## 2018-12-09 DIAGNOSIS — M1990 Unspecified osteoarthritis, unspecified site: Secondary | ICD-10-CM | POA: Diagnosis not present

## 2018-12-09 DIAGNOSIS — R21 Rash and other nonspecific skin eruption: Secondary | ICD-10-CM | POA: Diagnosis not present

## 2018-12-09 MED FILL — HUMALOG MIX 75-25 KWIKPEN: (75-25) 100 | 30 days supply | Qty: 39 | Fill #0

## 2018-12-09 MED FILL — FUROSEMIDE 80 MG TAB: 80 | 90 days supply | Qty: 45 | Fill #0

## 2018-12-09 MED FILL — LISINOPRIL 40 MG TABLET: 40 | 90 days supply | Qty: 90 | Fill #0

## 2018-12-09 MED FILL — predniSONE 20 MG TABS: 20 | 7 days supply | Qty: 8 | Fill #0

## 2018-12-09 MED FILL — FREESTYLE LITE TEST STRIP: 33 days supply | Qty: 100 | Fill #0

## 2018-12-09 MED FILL — POTASSIUM CHLORIDE CRYS ER: 20 | 30 days supply | Qty: 30 | Fill #0

## 2019-01-13 MED FILL — HUMALOG MIX 75-25 KWIKPEN: (75-25) 100 | 30 days supply | Qty: 39 | Fill #1

## 2019-01-13 MED FILL — POTASSIUM CHLORIDE CRYS ER: 20 | 30 days supply | Qty: 30 | Fill #1

## 2019-01-13 MED FILL — FREESTYLE LITE TEST STRIP: 90 days supply | Qty: 300 | Fill #0

## 2019-02-11 MED FILL — UNIFINE PENTIPS 8MM 31G: 31G X 8 MM | 90 days supply | Qty: 200 | Fill #1

## 2019-02-11 MED FILL — HUMALOG MIX 75-25 KWIKPEN: (75-25) 100 | 30 days supply | Qty: 39 | Fill #0

## 2019-02-11 MED FILL — POTASSIUM CHLORIDE CRYS ER: 20 | 30 days supply | Qty: 30 | Fill #0

## 2019-02-11 MED FILL — FELODIPINE ER 10 MG TABLET: 10 | 90 days supply | Qty: 90 | Fill #1

## 2019-02-11 MED FILL — GLYBURIDE-METFORMIN 2.5-500: 2.5-500 | 90 days supply | Qty: 360 | Fill #0

## 2019-03-11 MED FILL — HUMALOG MIX 75-25 KWIKPEN: (75-25) 100 | 30 days supply | Qty: 39 | Fill #1

## 2019-03-11 MED FILL — FUROSEMIDE 80 MG TABLET: 80 | 90 days supply | Qty: 45 | Fill #0

## 2019-03-11 MED FILL — LISINOPRIL 40 MG TABLET: 40 | 90 days supply | Qty: 90 | Fill #0

## 2019-05-13 DIAGNOSIS — I10 Essential (primary) hypertension: Secondary | ICD-10-CM | POA: Diagnosis not present

## 2019-05-13 DIAGNOSIS — R82998 Other abnormal findings in urine: Secondary | ICD-10-CM | POA: Diagnosis not present

## 2019-05-13 DIAGNOSIS — E7849 Other hyperlipidemia: Secondary | ICD-10-CM | POA: Diagnosis not present

## 2019-05-13 DIAGNOSIS — Z0000 Encounter for general adult medical examination without abnormal findings: Secondary | ICD-10-CM | POA: Diagnosis not present

## 2019-05-13 DIAGNOSIS — E1122 Type 2 diabetes mellitus with diabetic chronic kidney disease: Secondary | ICD-10-CM | POA: Diagnosis not present

## 2019-05-13 MED FILL — HUMALOG MIX 75-25 KWIKPEN: (75-25) 100 | 28 days supply | Qty: 30 | Fill #0

## 2019-05-13 MED FILL — UNIFINE PENTIPS 8MM 31G: 31G X 8 MM | 90 days supply | Qty: 200 | Fill #2

## 2019-05-13 MED FILL — FELODIPINE ER 10 MG TABLET: 10 | 90 days supply | Qty: 90 | Fill #2

## 2019-05-21 DIAGNOSIS — D649 Anemia, unspecified: Secondary | ICD-10-CM | POA: Diagnosis not present

## 2019-05-21 DIAGNOSIS — N183 Chronic kidney disease, stage 3 (moderate): Secondary | ICD-10-CM | POA: Diagnosis not present

## 2019-05-21 DIAGNOSIS — E1165 Type 2 diabetes mellitus with hyperglycemia: Secondary | ICD-10-CM | POA: Diagnosis not present

## 2019-05-21 DIAGNOSIS — Z0000 Encounter for general adult medical examination without abnormal findings: Secondary | ICD-10-CM | POA: Diagnosis not present

## 2019-05-21 DIAGNOSIS — R252 Cramp and spasm: Secondary | ICD-10-CM | POA: Diagnosis not present

## 2019-05-21 DIAGNOSIS — F17200 Nicotine dependence, unspecified, uncomplicated: Secondary | ICD-10-CM | POA: Diagnosis not present

## 2019-05-21 DIAGNOSIS — I129 Hypertensive chronic kidney disease with stage 1 through stage 4 chronic kidney disease, or unspecified chronic kidney disease: Secondary | ICD-10-CM | POA: Diagnosis not present

## 2019-05-21 DIAGNOSIS — M1990 Unspecified osteoarthritis, unspecified site: Secondary | ICD-10-CM | POA: Diagnosis not present

## 2019-05-21 DIAGNOSIS — E785 Hyperlipidemia, unspecified: Secondary | ICD-10-CM | POA: Diagnosis not present

## 2019-05-21 MED FILL — SHINGRIX 50 MCG SUS: 50 | 1 days supply | Qty: 1 | Fill #0

## 2019-06-11 MED FILL — HUMALOG MIX 75-25 KWIKPEN: (75-25) 100 | 28 days supply | Qty: 30 | Fill #1

## 2019-06-11 MED FILL — LISINOPRIL 40 MG TABLET: 40 | 90 days supply | Qty: 90 | Fill #0

## 2019-06-11 MED FILL — FUROSEMIDE 80 MG TAB: 80 | 90 days supply | Qty: 45 | Fill #1

## 2019-06-24 MED FILL — DICLOFENAC SODIUM 1 % GEL: 1 | 7 days supply | Qty: 100 | Fill #1

## 2019-06-24 MED FILL — FREESTYLE LITE TEST STRIP: 90 days supply | Qty: 300 | Fill #0

## 2019-07-21 MED FILL — HUMALOG MIX 75-25 KWIKPEN: (75-25) 100 | 28 days supply | Qty: 30 | Fill #2

## 2019-07-27 MED FILL — SHINGRIX 50 MCG SUS: 50 | 1 days supply | Qty: 1 | Fill #1

## 2019-08-12 MED FILL — FELODIPINE ER 10 MG TABLET: 10 | 90 days supply | Qty: 90 | Fill #0

## 2019-08-24 MED FILL — HUMALOG MIX 75-25 KWIKPEN: (75-25) 100 | 28 days supply | Qty: 30 | Fill #3

## 2019-09-10 DIAGNOSIS — N1831 Chronic kidney disease, stage 3a: Secondary | ICD-10-CM | POA: Diagnosis not present

## 2019-09-10 DIAGNOSIS — I129 Hypertensive chronic kidney disease with stage 1 through stage 4 chronic kidney disease, or unspecified chronic kidney disease: Secondary | ICD-10-CM | POA: Diagnosis not present

## 2019-09-10 DIAGNOSIS — E785 Hyperlipidemia, unspecified: Secondary | ICD-10-CM | POA: Diagnosis not present

## 2019-09-10 DIAGNOSIS — F430 Acute stress reaction: Secondary | ICD-10-CM | POA: Diagnosis not present

## 2019-09-10 DIAGNOSIS — E1165 Type 2 diabetes mellitus with hyperglycemia: Secondary | ICD-10-CM | POA: Diagnosis not present

## 2019-09-10 DIAGNOSIS — F17200 Nicotine dependence, unspecified, uncomplicated: Secondary | ICD-10-CM | POA: Diagnosis not present

## 2019-09-10 MED FILL — ALPRAZolam 0.5 MG TABS: 0.5 | 7 days supply | Qty: 30 | Fill #0

## 2019-09-10 MED FILL — LISINOPRIL 40 MG TABLET: 40 | 90 days supply | Qty: 90 | Fill #1

## 2019-09-10 MED FILL — FUROSEMIDE 80 MG TAB: 80 | 90 days supply | Qty: 45 | Fill #2

## 2019-09-10 MED FILL — ESCITALOPRAM 5 MG TABLET: 5 | 90 days supply | Qty: 90 | Fill #0

## 2019-10-05 MED FILL — HUMALOG MIX 75-25 KWIKPEN: (75-25) 100 | 28 days supply | Qty: 36 | Fill #4

## 2019-11-11 MED FILL — HUMALOG MIX 75-25 KWIKPEN: (75-25) 100 | 30 days supply | Qty: 39 | Fill #0

## 2019-11-11 MED FILL — FELODIPINE ER 10 MG TABLET: 10 | 90 days supply | Qty: 90 | Fill #1

## 2019-12-09 MED FILL — ESCITALOPRAM 5 MG TABLET: 5 | 90 days supply | Qty: 90 | Fill #1

## 2019-12-09 MED FILL — HUMALOG MIX 75-25 KWIKPEN: (75-25) 100 | 30 days supply | Qty: 39 | Fill #1

## 2019-12-09 MED FILL — LISINOPRIL 40 MG TABS: 40 | 90 days supply | Qty: 90 | Fill #2

## 2019-12-10 ENCOUNTER — Other Ambulatory Visit (HOSPITAL_COMMUNITY): Payer: Self-pay | Admitting: Internal Medicine

## 2019-12-10 MED FILL — FUROSEMIDE 80 MG TAB: 80 | 90 days supply | Qty: 45 | Fill #0

## 2020-01-13 MED FILL — HUMALOG MIX 75-25 KWIKPEN: (75-25) 100 | 30 days supply | Qty: 39 | Fill #2

## 2020-02-10 ENCOUNTER — Other Ambulatory Visit (HOSPITAL_COMMUNITY): Payer: Self-pay | Admitting: Endocrinology

## 2020-02-10 MED FILL — FELODIPINE ER 10 MG TABLET: 10 | 90 days supply | Qty: 90 | Fill #0

## 2020-02-10 MED FILL — HUMALOG MIX 75-25 KWIKPEN: (75-25) 100 | 30 days supply | Qty: 39 | Fill #0

## 2020-03-09 MED FILL — ESCITALOPRAM 5 MG TABLET: 5 | 90 days supply | Qty: 90 | Fill #2

## 2020-03-09 MED FILL — HUMALOG MIX 75-25 KWIKPEN: (75-25) 100 | 30 days supply | Qty: 39 | Fill #1

## 2020-03-09 MED FILL — LISINOPRIL 40 MG TABS: 40 | 90 days supply | Qty: 90 | Fill #3

## 2020-03-09 MED FILL — FUROSEMIDE 80 MG TAB: 80 | 90 days supply | Qty: 45 | Fill #1

## 2020-03-22 ENCOUNTER — Other Ambulatory Visit (HOSPITAL_COMMUNITY): Payer: Self-pay | Admitting: Internal Medicine

## 2020-03-22 MED FILL — FREESTYLE LITE TEST STRIP: 83 days supply | Qty: 250 | Fill #0

## 2020-03-22 MED FILL — SIMVASTATIN 40 MG TABLET: 40 | 90 days supply | Qty: 90 | Fill #0

## 2020-05-12 MED FILL — FELODIPINE ER 10 MG TABLET: 10 | 90 days supply | Qty: 90 | Fill #1

## 2020-05-12 MED FILL — HUMALOG MIX 75-25 KWIKPEN: (75-25) 100 | 30 days supply | Qty: 39 | Fill #2

## 2020-06-01 MED FILL — ESCITALOPRAM 5 MG TABLET: 5 | 90 days supply | Qty: 90 | Fill #3

## 2020-06-01 MED FILL — LISINOPRIL 40 MG TABS: 40 | 90 days supply | Qty: 90 | Fill #0

## 2020-06-14 DIAGNOSIS — I10 Essential (primary) hypertension: Secondary | ICD-10-CM | POA: Diagnosis not present

## 2020-06-14 DIAGNOSIS — Z0000 Encounter for general adult medical examination without abnormal findings: Secondary | ICD-10-CM | POA: Diagnosis not present

## 2020-06-14 DIAGNOSIS — E785 Hyperlipidemia, unspecified: Secondary | ICD-10-CM | POA: Diagnosis not present

## 2020-06-14 DIAGNOSIS — E1122 Type 2 diabetes mellitus with diabetic chronic kidney disease: Secondary | ICD-10-CM | POA: Diagnosis not present

## 2020-06-21 ENCOUNTER — Other Ambulatory Visit (HOSPITAL_COMMUNITY): Payer: Self-pay | Admitting: Internal Medicine

## 2020-06-21 DIAGNOSIS — Z0000 Encounter for general adult medical examination without abnormal findings: Secondary | ICD-10-CM | POA: Diagnosis not present

## 2020-06-21 DIAGNOSIS — E785 Hyperlipidemia, unspecified: Secondary | ICD-10-CM | POA: Diagnosis not present

## 2020-06-21 DIAGNOSIS — N1831 Chronic kidney disease, stage 3a: Secondary | ICD-10-CM | POA: Diagnosis not present

## 2020-06-21 DIAGNOSIS — I129 Hypertensive chronic kidney disease with stage 1 through stage 4 chronic kidney disease, or unspecified chronic kidney disease: Secondary | ICD-10-CM | POA: Diagnosis not present

## 2020-06-21 DIAGNOSIS — D649 Anemia, unspecified: Secondary | ICD-10-CM | POA: Diagnosis not present

## 2020-06-21 DIAGNOSIS — F17200 Nicotine dependence, unspecified, uncomplicated: Secondary | ICD-10-CM | POA: Diagnosis not present

## 2020-06-21 DIAGNOSIS — J309 Allergic rhinitis, unspecified: Secondary | ICD-10-CM | POA: Diagnosis not present

## 2020-06-21 DIAGNOSIS — E1165 Type 2 diabetes mellitus with hyperglycemia: Secondary | ICD-10-CM | POA: Diagnosis not present

## 2020-06-21 DIAGNOSIS — M1990 Unspecified osteoarthritis, unspecified site: Secondary | ICD-10-CM | POA: Diagnosis not present

## 2020-06-21 DIAGNOSIS — R82998 Other abnormal findings in urine: Secondary | ICD-10-CM | POA: Diagnosis not present

## 2020-06-21 MED FILL — TELMISARTAN 80 MG TABS: 80 | 90 days supply | Qty: 90 | Fill #0

## 2020-06-22 ENCOUNTER — Other Ambulatory Visit (HOSPITAL_COMMUNITY): Payer: Self-pay | Admitting: Internal Medicine

## 2020-06-22 MED FILL — GLYBURIDE-METFORMIN 2.5-500: 2.5-500 | 90 days supply | Qty: 360 | Fill #0

## 2020-06-29 ENCOUNTER — Other Ambulatory Visit (HOSPITAL_COMMUNITY): Payer: Self-pay | Admitting: Internal Medicine

## 2020-06-29 MED FILL — FUROSEMIDE 80 MG TAB: 80 | 90 days supply | Qty: 45 | Fill #2

## 2020-06-29 MED FILL — SIMVASTATIN 40 MG TABLET: 40 | 90 days supply | Qty: 90 | Fill #0

## 2020-07-11 ENCOUNTER — Other Ambulatory Visit (HOSPITAL_COMMUNITY): Payer: Self-pay | Admitting: Internal Medicine

## 2020-07-11 MED FILL — NEBIVOLOL HCL 5 MG TABS: 5 | 30 days supply | Qty: 30 | Fill #0

## 2020-07-15 MED FILL — HUMALOG MIX 75-25 KWIKPEN: (75-25) 100 | 30 days supply | Qty: 39 | Fill #3

## 2020-08-09 ENCOUNTER — Other Ambulatory Visit (HOSPITAL_COMMUNITY): Payer: Self-pay | Admitting: Endocrinology

## 2020-08-09 MED FILL — NEBIVOLOL HCL 5 MG TABS: 5 | 30 days supply | Qty: 30 | Fill #1

## 2020-08-09 MED FILL — HUMALOG MIX 75-25 KWIKPEN: (75-25) 100 | 30 days supply | Qty: 39 | Fill #4

## 2020-08-10 MED FILL — FELODIPINE ER 10 MG TABLET: 10 | 90 days supply | Qty: 90 | Fill #0

## 2020-08-28 DIAGNOSIS — Z20828 Contact with and (suspected) exposure to other viral communicable diseases: Secondary | ICD-10-CM | POA: Diagnosis not present

## 2020-09-05 DIAGNOSIS — Z20828 Contact with and (suspected) exposure to other viral communicable diseases: Secondary | ICD-10-CM | POA: Diagnosis not present

## 2020-09-05 DIAGNOSIS — Z20822 Contact with and (suspected) exposure to covid-19: Secondary | ICD-10-CM | POA: Diagnosis not present

## 2020-09-07 MED FILL — HUMALOG MIX 75-25 KWIKPEN: (75-25) 100 | 30 days supply | Qty: 39 | Fill #5

## 2020-09-07 MED FILL — NEBIVOLOL HCL 5 MG TABS: 5 | 30 days supply | Qty: 30 | Fill #2

## 2020-09-21 MED FILL — FREESTYLE LITE TEST STRIP: 83 days supply | Qty: 250 | Fill #1

## 2020-09-21 MED FILL — TELMISARTAN 80 MG TABS: 80 | 90 days supply | Qty: 90 | Fill #1

## 2020-10-05 ENCOUNTER — Other Ambulatory Visit (HOSPITAL_COMMUNITY): Payer: Self-pay | Admitting: Internal Medicine

## 2020-10-05 MED FILL — FUROSEMIDE 80 MG TAB: 80 | 90 days supply | Qty: 45 | Fill #0

## 2020-10-05 MED FILL — SIMVASTATIN 40 MG TABLET: 40 | 90 days supply | Qty: 90 | Fill #1

## 2020-10-05 MED FILL — NEBIVOLOL HCL 5 MG TABS: 5 | 30 days supply | Qty: 30 | Fill #0

## 2020-10-18 ENCOUNTER — Other Ambulatory Visit (HOSPITAL_COMMUNITY): Payer: Self-pay | Admitting: Internal Medicine

## 2020-10-18 DIAGNOSIS — M1990 Unspecified osteoarthritis, unspecified site: Secondary | ICD-10-CM | POA: Diagnosis not present

## 2020-10-18 DIAGNOSIS — F17200 Nicotine dependence, unspecified, uncomplicated: Secondary | ICD-10-CM | POA: Diagnosis not present

## 2020-10-18 DIAGNOSIS — I129 Hypertensive chronic kidney disease with stage 1 through stage 4 chronic kidney disease, or unspecified chronic kidney disease: Secondary | ICD-10-CM | POA: Diagnosis not present

## 2020-10-18 DIAGNOSIS — J309 Allergic rhinitis, unspecified: Secondary | ICD-10-CM | POA: Diagnosis not present

## 2020-10-18 DIAGNOSIS — E1165 Type 2 diabetes mellitus with hyperglycemia: Secondary | ICD-10-CM | POA: Diagnosis not present

## 2020-10-18 DIAGNOSIS — D649 Anemia, unspecified: Secondary | ICD-10-CM | POA: Diagnosis not present

## 2020-10-18 DIAGNOSIS — E785 Hyperlipidemia, unspecified: Secondary | ICD-10-CM | POA: Diagnosis not present

## 2020-10-18 DIAGNOSIS — N1831 Chronic kidney disease, stage 3a: Secondary | ICD-10-CM | POA: Diagnosis not present

## 2020-10-18 DIAGNOSIS — F430 Acute stress reaction: Secondary | ICD-10-CM | POA: Diagnosis not present

## 2020-10-18 MED FILL — ESCITALOPRAM 10 MG TABLET: 10 | 90 days supply | Qty: 90 | Fill #0

## 2020-11-28 ENCOUNTER — Other Ambulatory Visit (HOSPITAL_COMMUNITY): Payer: Self-pay

## 2020-12-06 ENCOUNTER — Other Ambulatory Visit (HOSPITAL_COMMUNITY): Payer: Self-pay

## 2020-12-06 MED FILL — Nebivolol HCl Tab 5 MG (Base Equivalent): ORAL | 30 days supply | Qty: 30 | Fill #0 | Status: AC

## 2020-12-07 ENCOUNTER — Other Ambulatory Visit (HOSPITAL_COMMUNITY): Payer: Self-pay

## 2020-12-21 ENCOUNTER — Other Ambulatory Visit (HOSPITAL_COMMUNITY): Payer: Self-pay

## 2020-12-21 MED FILL — Telmisartan Tab 80 MG: ORAL | 90 days supply | Qty: 90 | Fill #0 | Status: AC

## 2021-01-04 ENCOUNTER — Other Ambulatory Visit (HOSPITAL_COMMUNITY): Payer: Self-pay

## 2021-01-04 MED FILL — Simvastatin Tab 40 MG: ORAL | 90 days supply | Qty: 90 | Fill #0 | Status: AC

## 2021-01-04 MED FILL — Nebivolol HCl Tab 5 MG (Base Equivalent): ORAL | 30 days supply | Qty: 30 | Fill #1 | Status: AC

## 2021-01-04 MED FILL — Furosemide Tab 80 MG: ORAL | 90 days supply | Qty: 45 | Fill #0 | Status: AC

## 2021-01-05 ENCOUNTER — Other Ambulatory Visit (HOSPITAL_COMMUNITY): Payer: Self-pay

## 2021-01-18 MED FILL — Escitalopram Oxalate Tab 10 MG (Base Equiv): ORAL | 90 days supply | Qty: 90 | Fill #0 | Status: AC

## 2021-01-19 ENCOUNTER — Other Ambulatory Visit (HOSPITAL_COMMUNITY): Payer: Self-pay

## 2021-02-02 ENCOUNTER — Other Ambulatory Visit: Payer: Self-pay | Admitting: Internal Medicine

## 2021-02-02 ENCOUNTER — Other Ambulatory Visit (HOSPITAL_COMMUNITY): Payer: Self-pay

## 2021-02-02 DIAGNOSIS — R590 Localized enlarged lymph nodes: Secondary | ICD-10-CM

## 2021-02-02 MED FILL — Nebivolol HCl Tab 5 MG (Base Equivalent): ORAL | 30 days supply | Qty: 30 | Fill #2 | Status: AC

## 2021-02-02 MED FILL — Felodipine Tab ER 24HR 10 MG: ORAL | 90 days supply | Qty: 90 | Fill #0 | Status: AC

## 2021-02-08 ENCOUNTER — Other Ambulatory Visit: Payer: Self-pay | Admitting: Internal Medicine

## 2021-02-08 DIAGNOSIS — Z1231 Encounter for screening mammogram for malignant neoplasm of breast: Secondary | ICD-10-CM

## 2021-02-14 ENCOUNTER — Other Ambulatory Visit: Payer: Self-pay | Admitting: Internal Medicine

## 2021-02-14 ENCOUNTER — Ambulatory Visit
Admission: RE | Admit: 2021-02-14 | Discharge: 2021-02-14 | Disposition: A | Discharge status: Home / Self Care | Payer: 59 | Source: Ambulatory Visit | Attending: Internal Medicine | Admitting: Internal Medicine

## 2021-02-14 DIAGNOSIS — R590 Localized enlarged lymph nodes: Secondary | ICD-10-CM

## 2021-03-03 ENCOUNTER — Other Ambulatory Visit (HOSPITAL_COMMUNITY): Payer: Self-pay

## 2021-03-07 ENCOUNTER — Other Ambulatory Visit (HOSPITAL_COMMUNITY): Payer: Self-pay

## 2021-03-07 MED FILL — Nebivolol HCl Tab 5 MG (Base Equivalent): ORAL | 30 days supply | Qty: 30 | Fill #3 | Status: AC

## 2021-03-07 MED FILL — Glyburide-Metformin Tab 2.5-500 MG: ORAL | 90 days supply | Qty: 360 | Fill #0 | Status: AC

## 2021-03-08 ENCOUNTER — Other Ambulatory Visit (HOSPITAL_COMMUNITY): Payer: Self-pay

## 2021-03-23 ENCOUNTER — Other Ambulatory Visit (HOSPITAL_COMMUNITY): Payer: Self-pay

## 2021-03-23 MED ORDER — POTASSIUM CHLORIDE CRYS ER 20 MEQ PO TBCR
10.0000 meq | EXTENDED_RELEASE_TABLET | Freq: Every day | ORAL | 3 refills | Status: AC
  Filled 2021-03-23: qty 45, 90d supply, fill #0
  Filled 2021-07-03: qty 45, 90d supply, fill #1

## 2021-03-24 ENCOUNTER — Other Ambulatory Visit (HOSPITAL_COMMUNITY): Payer: Self-pay

## 2021-03-24 MED FILL — Telmisartan Tab 80 MG: ORAL | 90 days supply | Qty: 90 | Fill #1 | Status: AC

## 2021-04-04 ENCOUNTER — Other Ambulatory Visit (HOSPITAL_COMMUNITY): Payer: Self-pay

## 2021-04-04 MED FILL — Simvastatin Tab 40 MG: ORAL | 90 days supply | Qty: 90 | Fill #1 | Status: AC

## 2021-04-05 ENCOUNTER — Ambulatory Visit
Admission: RE | Admit: 2021-04-05 | Discharge: 2021-04-05 | Disposition: A | Discharge status: Home / Self Care | Payer: 59 | Source: Ambulatory Visit | Attending: Internal Medicine | Admitting: Internal Medicine

## 2021-04-05 ENCOUNTER — Other Ambulatory Visit: Payer: Self-pay

## 2021-04-05 ENCOUNTER — Other Ambulatory Visit (HOSPITAL_COMMUNITY): Payer: Self-pay

## 2021-04-05 DIAGNOSIS — Z1231 Encounter for screening mammogram for malignant neoplasm of breast: Secondary | ICD-10-CM

## 2021-04-05 MED ORDER — NEBIVOLOL HCL 5 MG PO TABS
5.0000 mg | ORAL_TABLET | Freq: Every day | ORAL | 3 refills | Status: DC
  Filled 2021-04-05: qty 90, 90d supply, fill #0
  Filled 2021-07-03: qty 90, 90d supply, fill #1

## 2021-04-05 MED FILL — Furosemide Tab 80 MG: ORAL | 90 days supply | Qty: 45 | Fill #1 | Status: AC

## 2021-04-06 ENCOUNTER — Other Ambulatory Visit (HOSPITAL_COMMUNITY): Payer: Self-pay

## 2021-04-18 ENCOUNTER — Other Ambulatory Visit (HOSPITAL_COMMUNITY): Payer: Self-pay

## 2021-04-18 MED FILL — Escitalopram Oxalate Tab 10 MG (Base Equiv): ORAL | 90 days supply | Qty: 90 | Fill #1 | Status: AC

## 2021-05-08 MED FILL — Felodipine Tab ER 24HR 10 MG: ORAL | 90 days supply | Qty: 90 | Fill #1 | Status: CN

## 2021-05-09 ENCOUNTER — Other Ambulatory Visit (HOSPITAL_COMMUNITY): Payer: Self-pay

## 2021-05-09 MED FILL — Felodipine Tab ER 24HR 10 MG: ORAL | 90 days supply | Qty: 90 | Fill #1 | Status: AC

## 2021-05-19 ENCOUNTER — Other Ambulatory Visit (HOSPITAL_COMMUNITY): Payer: Self-pay

## 2021-06-12 ENCOUNTER — Other Ambulatory Visit (HOSPITAL_COMMUNITY): Payer: Self-pay

## 2021-06-12 MED ORDER — TELMISARTAN 80 MG PO TABS
80.0000 mg | ORAL_TABLET | Freq: Every day | ORAL | 3 refills | Status: DC
  Filled 2021-06-12: qty 90, 90d supply, fill #0

## 2021-06-12 MED FILL — Glyburide-Metformin Tab 2.5-500 MG: ORAL | 90 days supply | Qty: 360 | Fill #1 | Status: AC

## 2021-07-03 ENCOUNTER — Other Ambulatory Visit (HOSPITAL_COMMUNITY): Payer: Self-pay

## 2021-07-04 ENCOUNTER — Other Ambulatory Visit (HOSPITAL_COMMUNITY): Payer: Self-pay

## 2021-07-04 MED ORDER — SIMVASTATIN 40 MG PO TABS
40.0000 mg | ORAL_TABLET | Freq: Every day | ORAL | 4 refills | Status: DC
  Filled 2021-07-04: qty 90, 90d supply, fill #0

## 2021-07-04 MED ORDER — FUROSEMIDE 80 MG PO TABS
40.0000 mg | ORAL_TABLET | Freq: Every day | ORAL | 4 refills | Status: DC
  Filled 2021-07-04: qty 45, 90d supply, fill #0

## 2021-07-18 DIAGNOSIS — R82998 Other abnormal findings in urine: Secondary | ICD-10-CM | POA: Diagnosis not present

## 2021-08-16 ENCOUNTER — Emergency Department (HOSPITAL_COMMUNITY): Payer: Medicare HMO

## 2021-08-16 ENCOUNTER — Encounter (HOSPITAL_COMMUNITY): Payer: Self-pay

## 2021-08-16 ENCOUNTER — Emergency Department (HOSPITAL_COMMUNITY)
Admission: EM | Admit: 2021-08-16 | Discharge: 2021-08-16 | Disposition: A | Discharge status: Home / Self Care | Payer: Medicare HMO | Attending: Student | Admitting: Student

## 2021-08-16 ENCOUNTER — Other Ambulatory Visit: Payer: Self-pay

## 2021-08-16 DIAGNOSIS — G8911 Acute pain due to trauma: Secondary | ICD-10-CM | POA: Insufficient documentation

## 2021-08-16 DIAGNOSIS — R519 Headache, unspecified: Secondary | ICD-10-CM | POA: Diagnosis not present

## 2021-08-16 DIAGNOSIS — S12001A Unspecified nondisplaced fracture of first cervical vertebra, initial encounter for closed fracture: Secondary | ICD-10-CM

## 2021-08-16 DIAGNOSIS — S199XXA Unspecified injury of neck, initial encounter: Secondary | ICD-10-CM | POA: Diagnosis present

## 2021-08-16 DIAGNOSIS — F17200 Nicotine dependence, unspecified, uncomplicated: Secondary | ICD-10-CM | POA: Diagnosis not present

## 2021-08-16 DIAGNOSIS — Z7984 Long term (current) use of oral hypoglycemic drugs: Secondary | ICD-10-CM | POA: Insufficient documentation

## 2021-08-16 DIAGNOSIS — S12031A Nondisplaced posterior arch fracture of first cervical vertebra, initial encounter for closed fracture: Secondary | ICD-10-CM | POA: Insufficient documentation

## 2021-08-16 DIAGNOSIS — E119 Type 2 diabetes mellitus without complications: Secondary | ICD-10-CM | POA: Insufficient documentation

## 2021-08-16 DIAGNOSIS — I10 Essential (primary) hypertension: Secondary | ICD-10-CM | POA: Insufficient documentation

## 2021-08-16 DIAGNOSIS — W108XXA Fall (on) (from) other stairs and steps, initial encounter: Secondary | ICD-10-CM | POA: Diagnosis not present

## 2021-08-16 DIAGNOSIS — Z794 Long term (current) use of insulin: Secondary | ICD-10-CM | POA: Diagnosis not present

## 2021-08-16 DIAGNOSIS — Z7982 Long term (current) use of aspirin: Secondary | ICD-10-CM | POA: Diagnosis not present

## 2021-08-16 HISTORY — DX: Essential (primary) hypertension: I10

## 2021-08-16 HISTORY — DX: Type 2 diabetes mellitus without complications: E11.9

## 2021-08-16 NOTE — Progress Notes (Signed)
Called in regards to this patient's CT cspine. Attempted to call provider back 3 times. I have reviewed the CT which showed a C1 posterior arch fracture on the right. No surgical intervention warranted at this time. Would recommend aspen collar to be worn 24/7 and follow up in our office in 2 weeks with xrays.

## 2021-08-16 NOTE — ED Provider Notes (Signed)
[facility]-EMERGENCY DEPT Provider Note   CSN: [id number] Arrival date & time: 08/16/21  1715     History Chief Complaint  Patient presents with   Neck Pain   Fall    Robin Duran is a 70 y.o. female with history of hypertension, hyperlipidemia, diabetes mellitus.  Presents emergency department with a chief complaint of neck pain and head pain after suffering a fall.  Patient states that she suffered a fall on 12/16.  Patient states that she was walking up a flight of steps when she missed a step and fell hitting her face on the step.  Patient denies any loss of consciousness.  Patient reports that she has had pain to the right side of her forehead and right side of her neck since this injury.  Pain has remained constant over this time.  Pain has been unchanged over this time.  Patient rates pain 4/10 on the pain scale.  Pain is worse with cervical neck motion.  Patient reports that she had had improvement in her pain when taking Motrin.  Patient denies any blood thinner use.  Denies any numbness, weakness, saddle anesthesia, bowel or bladder dysfunction, headache, visual disturbance, facial asymmetry, dysarthria.   Neck Pain Associated symptoms: no chest pain, no fever, no headaches, no numbness and no weakness   Fall Pertinent negatives include no chest pain, no abdominal pain, no headaches and no shortness of breath.      Past Medical History:  Diagnosis Date   Cataract    Diabetes mellitus without complication (HCC)    Extremity edema    Heart murmur    Hyperlipidemia    Hypertension     There are no problems to display for this patient.   Past Surgical History:  Procedure Laterality Date   CATARACT EXTRACTION, BILATERAL     COLONOSCOPY     DILATION AND CURETTAGE OF UTERUS     miscarriage     OB History   No obstetric history on file.     Family History  Problem Relation Age of Onset   Diabetes Mother    Heart disease Father    Colon  polyps Sister    Diabetes Sister    Heart disease Sister    Colon polyps Brother    Diabetes Brother    Colon cancer Neg Hx    Breast cancer Neg Hx     Social History   Tobacco Use   Smoking status: Some Days   Smokeless tobacco: Never  Substance Use Topics   Alcohol use: Yes    Alcohol/week: 14.0 standard drinks    Types: 14 Cans of beer per week   Drug use: No    Home Medications Prior to Admission medications   Medication Sig Start Date End Date Taking? Authorizing Provider  aspirin 81 MG chewable tablet Chew by mouth daily.    [provider]  escitalopram (LEXAPRO) 10 MG tablet TAKE 1 TABLET BY MOUTH ONCE A DAY 10/18/20 10/18/21  Avva, Ravisankar, MD  felodipine (PLENDIL) 10 MG 24 hr tablet Take 10 mg by mouth daily.    [provider]  felodipine (PLENDIL) 10 MG 24 hr tablet TAKE 1 TABLET BY MOUTH ONCE DAILY. 08/09/20 08/09/21  Adrian Prince, MD  furosemide (LASIX) 40 MG tablet Take 40 mg by mouth.    [provider]  furosemide (LASIX) 80 MG tablet TAKE 1/2 TABLET BY MOUTH DAILY 12/10/19 12/09/20  Avva, Joylene Draft, MD  furosemide (LASIX) 80 MG tablet  Take 0.5 tablets (40 mg total) by mouth daily. 07/04/21     glyBURIDE-metformin (GLUCOVANCE) 2.5-500 MG tablet Take 2 tablets by mouth daily with breakfast. 2 in am 1 in pm    [provider]  glyBURIDE-metformin (GLUCOVANCE) 2.5-500 MG tablet TAKE 2 TABLETS BY MOUTH TWICE A DAY 06/22/20 09/10/21  Avva, Ravisankar, MD  insulin aspart protamine- aspart (NOVOLOG MIX 70/30) (70-30) 100 UNIT/ML injection Inject 30 Units into the skin. 30 u in am 60 u in pm    [provider]  Insulin Lispro Prot & Lispro (HUMALOG 75/25 MIX) (75-25) 100 UNIT/ML Kwikpen INJECT 70 UNITS INTO THE SKIN IN THE MORNING AND 60 UNITS IN THE EVENING. 02/10/20 02/09/21  Adrian Prince, MD  lisinopril (PRINIVIL,ZESTRIL) 40 MG tablet Take 40 mg by mouth daily.    [provider]  nebivolol (BYSTOLIC) 5 MG tablet  TAKE 1 TABLET BY MOUTH ONCE A DAY IN THE MORNING 07/11/20 07/11/21  Avva, Ravisankar, MD  nebivolol (BYSTOLIC) 5 MG tablet Take 1 tablet (5 mg total) by mouth daily. 04/05/21     potassium chloride SA (K-DUR,KLOR-CON) 20 MEQ tablet Take 20 mEq by mouth 2 (two) times daily.    [provider]  potassium chloride SA (KLOR-CON) 20 MEQ tablet Take 0.5 tablets (10 mEq total) by mouth daily. 03/23/21     simvastatin (ZOCOR) 40 MG tablet Take 40 mg by mouth daily.    [provider]  simvastatin (ZOCOR) 40 MG tablet TAKE 1 TABLET BY MOUTH ONCE A DAY 06/29/20 06/29/21  Avva, Ravisankar, MD  simvastatin (ZOCOR) 40 MG tablet Take 1 tablet (40 mg total) by mouth daily. 07/04/21     telmisartan (MICARDIS) 80 MG tablet Take 1 tablet (80 mg total) by mouth daily. 06/12/21     vitamin B-12 (CYANOCOBALAMIN) 1000 MCG tablet Take 1,000 mcg by mouth daily.    [provider]    Allergies    Patient has no known allergies.  Review of Systems   Review of Systems  Constitutional:  Negative for chills and fever.  HENT:  Negative for facial swelling.   Eyes:  Negative for visual disturbance.  Respiratory:  Negative for shortness of breath.   Cardiovascular:  Negative for chest pain.  Gastrointestinal:  Negative for abdominal pain, nausea and vomiting.  Genitourinary:  Negative for difficulty urinating and dysuria.  Musculoskeletal:  Positive for neck pain. Negative for back pain.  Skin:  Negative for color change and rash.  Neurological:  Negative for dizziness, tremors, seizures, syncope, facial asymmetry, speech difficulty, weakness, light-headedness, numbness and headaches.  Psychiatric/Behavioral:  Negative for confusion.    Physical Exam Updated Vital Signs BP (!) 189/78 (BP Location: Left Arm)    Pulse (!) 52    Temp 98 F (36.7 C) (Oral)    Resp 18    Ht 5\' 5"  (1.651 m)    Wt 84.4 kg    SpO2 100%    BMI 30.95 kg/m   Physical Exam Vitals and nursing note reviewed.   Constitutional:      General: She is not in acute distress.    Appearance: She is not ill-appearing, toxic-appearing or diaphoretic.  HENT:     Head: Normocephalic. No raccoon eyes, Battle's sign, abrasion, contusion, right periorbital erythema, left periorbital erythema or laceration.     Jaw: No trismus, tenderness, swelling, pain on movement or malocclusion.     Comments: Tenderness to right forehead Eyes:     General: No scleral icterus.  Right eye: No discharge.        Left eye: No discharge.     Extraocular Movements: Extraocular movements intact.     Pupils: Pupils are equal, round, and reactive to light.  Cardiovascular:     Rate and Rhythm: Normal rate.  Pulmonary:     Effort: Pulmonary effort is normal.  Abdominal:     Palpations: Abdomen is soft.     Tenderness: There is no abdominal tenderness.  Musculoskeletal:     Cervical back: Normal range of motion and neck supple. Tenderness present. No swelling, edema, deformity, erythema, signs of trauma, lacerations, rigidity, spasms, torticollis, bony tenderness or crepitus. Pain with movement present. Normal range of motion.     Thoracic back: No swelling, edema, deformity, signs of trauma, lacerations, spasms, tenderness or bony tenderness.     Lumbar back: No swelling, edema, deformity, signs of trauma, lacerations, spasms, tenderness or bony tenderness.     Comments: No midline tenderness or deformity to cervical, thoracic, or lumbar spine.  Tenderness to right sternocleidomastoid.  No tenderness, bony tenderness, or deformity to bilateral upper or lower extremities.  Skin:    General: Skin is warm and dry.  Neurological:     General: No focal deficit present.     Mental Status: She is alert.     GCS: GCS eye subscore is 4. GCS verbal subscore is 5. GCS motor subscore is 6.     Cranial Nerves: No cranial nerve deficit or facial asymmetry.     Sensory: Sensation is intact.     Motor: No weakness, tremor, seizure  activity or pronator drift.     Coordination: Finger-Nose-Finger Test normal.     Gait: Gait is intact. Gait normal.     Comments: CN II-XII intact, equal grip strength, +5 strength to bilateral upper and lower extremities, sensation to light touch intact to bilateral upper and lower extremities  Psychiatric:        Behavior: Behavior is cooperative.    ED Results / Procedures / Treatments   Labs (all labs ordered are listed, but only abnormal results are displayed) Labs Reviewed - No data to display  EKG EKG Interpretation  Date/Time:  Wednesday August 16 2021 21:21:44 EST Ventricular Rate:  50 PR Interval:  178 QRS Duration: 91 QT Interval:  455 QTC Calculation: 415 R Axis:   -6 Text Interpretation: Sinus rhythm Confirmed by Kommor, Madison ([id number]) on 08/16/2021 9:30:00 PM  Radiology CT HEAD WO CONTRAST (5MM)  Result Date: 08/16/2021 CLINICAL DATA:  Neck trauma (Age >= 65y); Head trauma, minor (Age >= 65y). Trip and fall. EXAM: CT HEAD WITHOUT CONTRAST CT CERVICAL SPINE WITHOUT CONTRAST TECHNIQUE: Multidetector CT imaging of the head and cervical spine was performed following the standard protocol without intravenous contrast. Multiplanar CT image reconstructions of the cervical spine were also generated. COMPARISON:  None. FINDINGS: CT HEAD FINDINGS BRAIN: BRAIN No evidence of large-territorial acute infarction. No parenchymal hemorrhage. No mass lesion. No extra-axial collection. No mass effect or midline shift. No hydrocephalus. Basilar cisterns are patent. Vascular: No hyperdense vessel. Atherosclerotic calcifications are present within the cavernous internal carotid arteries. Skull: No acute fracture or focal lesion. Sinuses/Orbits: Paranasal sinuses and mastoid air cells are clear. The orbits are unremarkable. Other: None. CT CERVICAL SPINE FINDINGS Alignment: Normal. Skull base and vertebrae: Acute minimally displaced right C1 posterior arch fracture (3:21, 7:26). Multilevel  moderate severe degenerative change of the spine with partial fusion of the C4-C5 and C5-C6 vertebral bodies. No aggressive appearing  focal osseous lesion or focal pathologic process. Soft tissues and spinal canal: No prevertebral fluid or swelling. No visible canal hematoma. Upper chest: Unremarkable. Other: None. IMPRESSION: 1. Acute minimally displaced right C1 posterior arch fracture. 2.  No acute intracranial abnormality. Electronically Signed   By: Iven Finn M.D.   On: 08/16/2021 18:37   CT Cervical Spine Wo Contrast  Result Date: 08/16/2021 CLINICAL DATA:  Neck trauma (Age >= 65y); Head trauma, minor (Age >= 65y). Trip and fall. EXAM: CT HEAD WITHOUT CONTRAST CT CERVICAL SPINE WITHOUT CONTRAST TECHNIQUE: Multidetector CT imaging of the head and cervical spine was performed following the standard protocol without intravenous contrast. Multiplanar CT image reconstructions of the cervical spine were also generated. COMPARISON:  None. FINDINGS: CT HEAD FINDINGS BRAIN: BRAIN No evidence of large-territorial acute infarction. No parenchymal hemorrhage. No mass lesion. No extra-axial collection. No mass effect or midline shift. No hydrocephalus. Basilar cisterns are patent. Vascular: No hyperdense vessel. Atherosclerotic calcifications are present within the cavernous internal carotid arteries. Skull: No acute fracture or focal lesion. Sinuses/Orbits: Paranasal sinuses and mastoid air cells are clear. The orbits are unremarkable. Other: None. CT CERVICAL SPINE FINDINGS Alignment: Normal. Skull base and vertebrae: Acute minimally displaced right C1 posterior arch fracture (3:21, 7:26). Multilevel moderate severe degenerative change of the spine with partial fusion of the C4-C5 and C5-C6 vertebral bodies. No aggressive appearing focal osseous lesion or focal pathologic process. Soft tissues and spinal canal: No prevertebral fluid or swelling. No visible canal hematoma. Upper chest: Unremarkable. Other:  None. IMPRESSION: 1. Acute minimally displaced right C1 posterior arch fracture. 2.  No acute intracranial abnormality. Electronically Signed   By: Iven Finn M.D.   On: 08/16/2021 18:37    Procedures Procedures   Medications Ordered in ED Medications - No data to display  ED Course  I have reviewed the triage vital signs and the nursing notes.  Pertinent labs & imaging results that were available during my care of the patient were reviewed by me and considered in my medical decision making (see chart for details).    MDM Rules/Calculators/A&P                          Alert 70 year old female no acute stress, nontoxic-appearing.  Presents to ED with chief complaint of neck pain after suffering a fall on 12/16.  Patient's neuro exam is reassuring.  However due to age over 12 year old.  Noncontrast head and cervical spine CT.  Noncontrast head CT shows no acute intracranial abnormality. Noncontrast cervical spine CT shows acute minimally displaced right C1 posterior arch fracture.  Due to patient's C1 fracture we will place her in Aspen collar and consult neurosurgery. Spoke to provider came with neurosurgery who agreed with Aspen collar.  Advised to have patient call office tomorrow to schedule follow-up appointment.  Patient noted to have bradycardia with rate in the 40s prior to discharge.  EKG shows sinus bradycardia.  Patient has documented pulse rates in similar range.  Will have patient follow-up with her primary care provider to discuss possible removal of beta-blocker from her daily medication regimen.  Discussed results, findings, treatment and follow up. Patient advised of return precautions. Patient verbalized understanding and agreed with plan.  Patient care discussed with attending physician Dr. Matilde Sprang     Final Clinical Impression(s) / ED Diagnoses Final diagnoses:  Closed nondisplaced fracture of first cervical vertebra, unspecified fracture morphology,  initial encounter [facility])    Rx /  DC Orders ED Discharge Orders     None        Dyann Ruddle 08/16/21 [id number]    Teressa Lower, MD 08/16/21 [phone]

## 2021-08-16 NOTE — ED Triage Notes (Signed)
Pt reports she tripped and fell down steps onto her face/head last Friday and reports pain to the right side of her neck, worse when she turns her head. Denies any other symptoms. No blood thinners, no LOC.

## 2021-08-16 NOTE — Discharge Instructions (Addendum)
You came to the emergency department today to be evaluated for your neck pain.  The CT scan of your neck showed that you have an acute minimally displaced right C1 posterior arch fracture.  Due to this you are placed in a cervical collar.  You will need to continue wearing this until you can follow-up with neurosurgery.  Please call [facility] tomorrow to schedule an appointment.  Get help right away if: You have neck pain that gets worse. You develop difficulties swallowing or breathing. You develop swelling in your neck. You have any of the following problems in your arms or legs or both: Numbness. Weakness. Burning pain. Movement problems. You are unable to control when you urinate or have a bowel movement (incontinence). You have problems with coordination or difficulty walking.

## 2021-08-28 ENCOUNTER — Other Ambulatory Visit (HOSPITAL_BASED_OUTPATIENT_CLINIC_OR_DEPARTMENT_OTHER): Payer: Self-pay

## 2021-08-28 ENCOUNTER — Other Ambulatory Visit (HOSPITAL_COMMUNITY): Payer: Self-pay

## 2021-08-28 MED ORDER — CLINDAMYCIN HCL 300 MG PO CAPS
ORAL_CAPSULE | ORAL | 0 refills | Status: DC
  Filled 2021-08-28: qty 10, 5d supply, fill #0
  Filled 2021-08-28: qty 10, 10d supply, fill #0

## 2021-08-29 ENCOUNTER — Other Ambulatory Visit (HOSPITAL_BASED_OUTPATIENT_CLINIC_OR_DEPARTMENT_OTHER): Payer: Self-pay

## 2021-12-04 ENCOUNTER — Other Ambulatory Visit (HOSPITAL_COMMUNITY): Payer: Self-pay

## 2021-12-04 MED ORDER — OLMESARTAN MEDOXOMIL 40 MG PO TABS
40.0000 mg | ORAL_TABLET | Freq: Every day | ORAL | 0 refills | Status: AC
  Filled 2021-12-04: qty 7, 7d supply, fill #0

## 2022-01-01 IMAGING — US US PELVIS LIMITED
1 series · 14 of 25 positions shown · non-contrast
Comparison: None.

CLINICAL DATA: Lump in left groin

EXAM:
LIMITED ULTRASOUND OF PELVIS
TECHNIQUE: Limited transabdominal ultrasound examination of the pelvis was
performed.

[Series 1: us pelvis limited · 45 acquisitions, 14 frames shown]
[im 1/45]
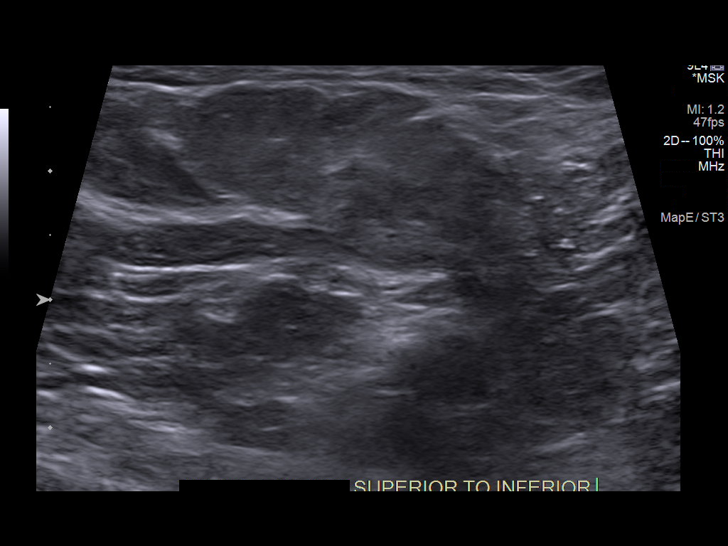
[im 4/45]
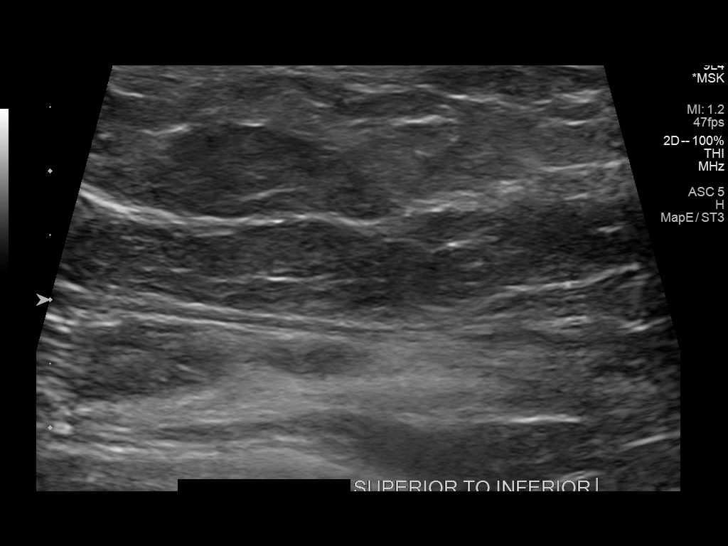
[im 8/45]
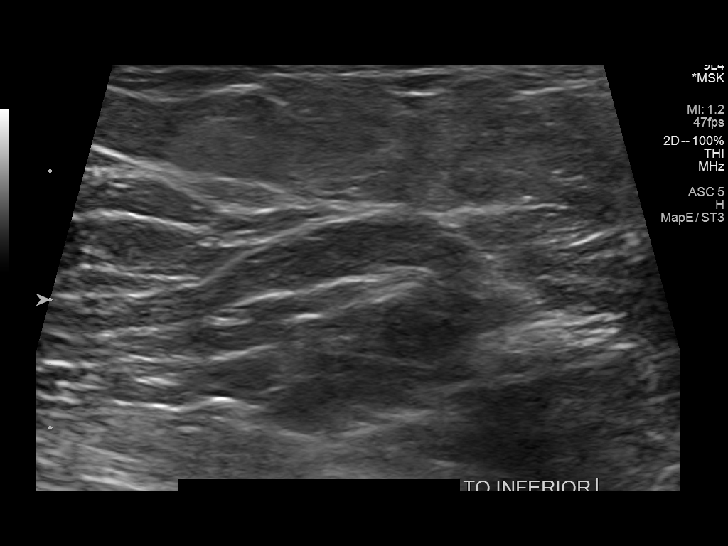
[im 12/45]
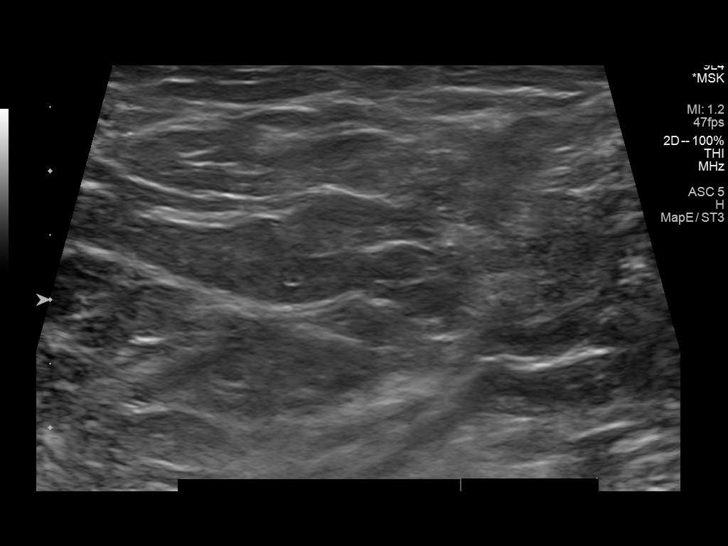
[im 15/45]
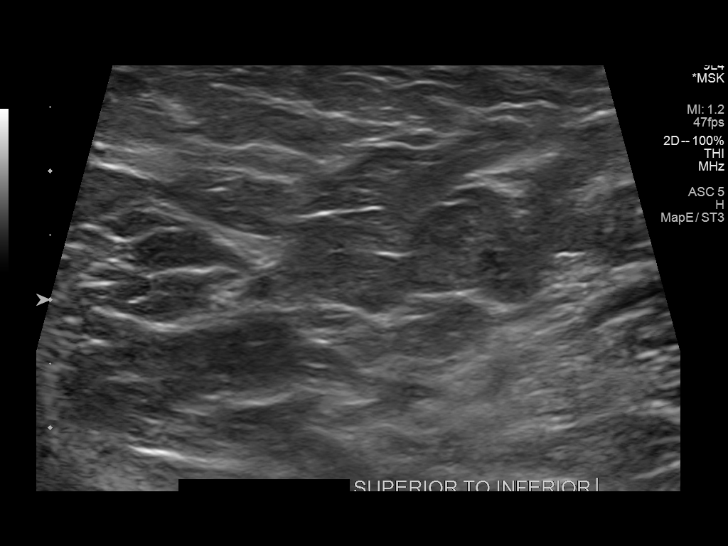
[im 17/45]
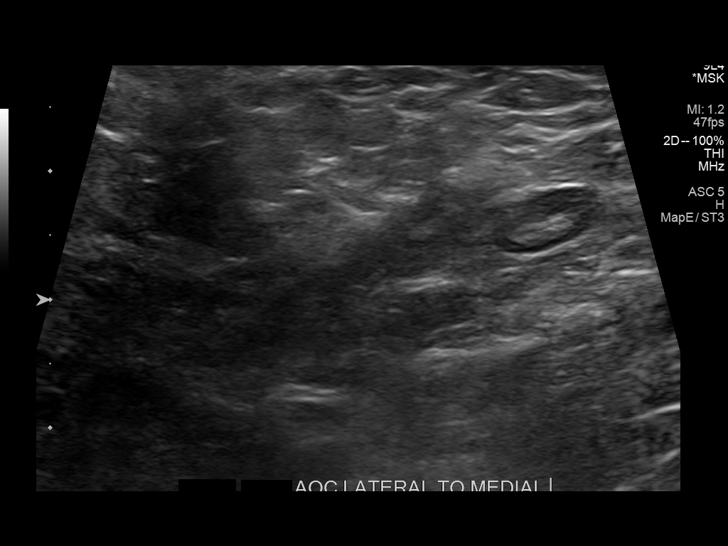
[im 21/45]
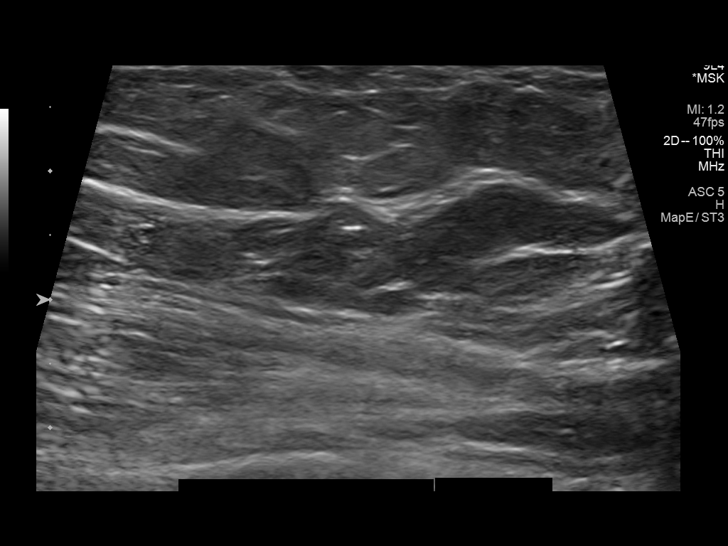
[im 24/45]
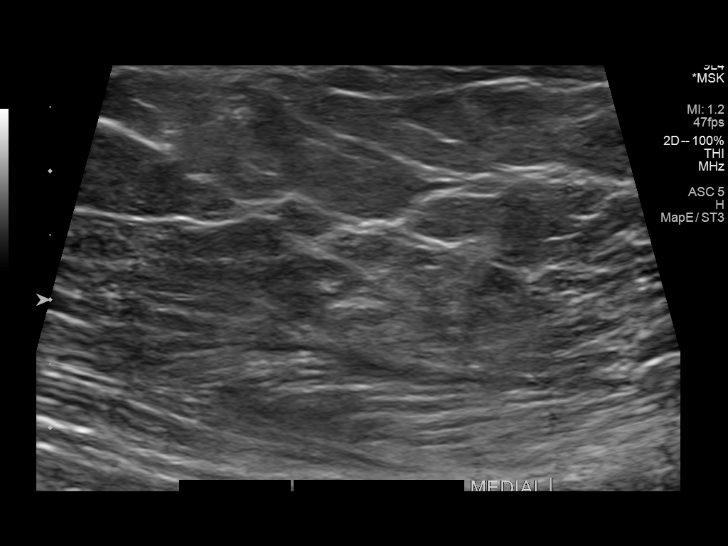
[im 28/45]
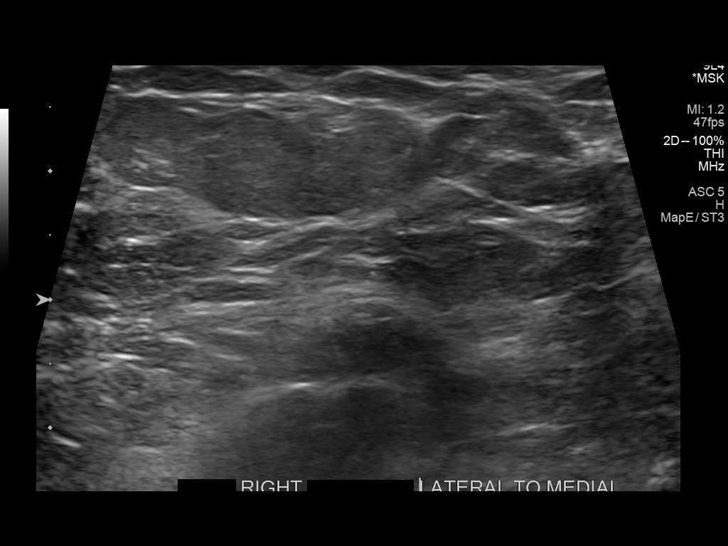
[im 30/45]
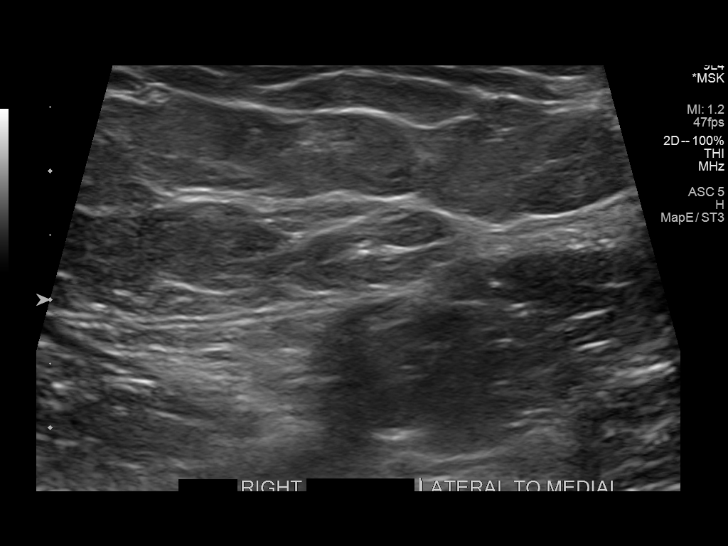
[im 34/45]
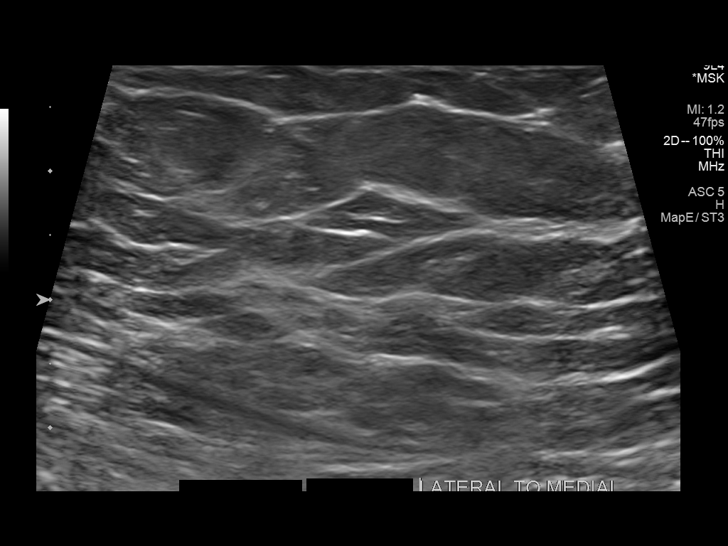
[im 37/45]
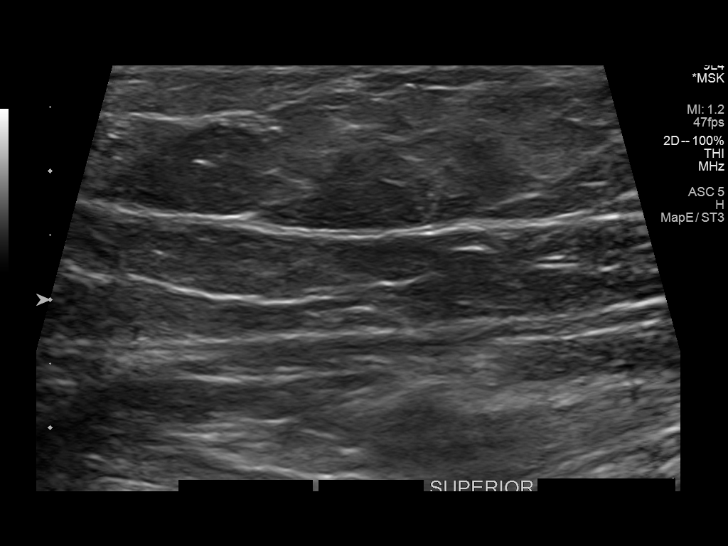
[im 41/45]
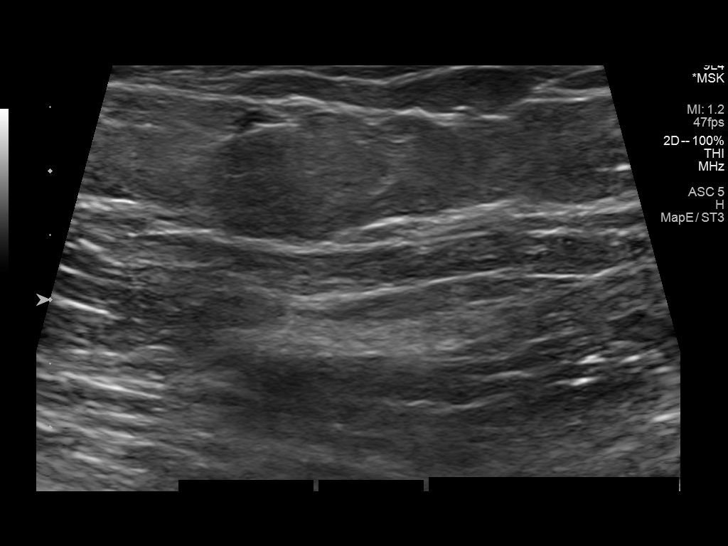
[im 45/45]
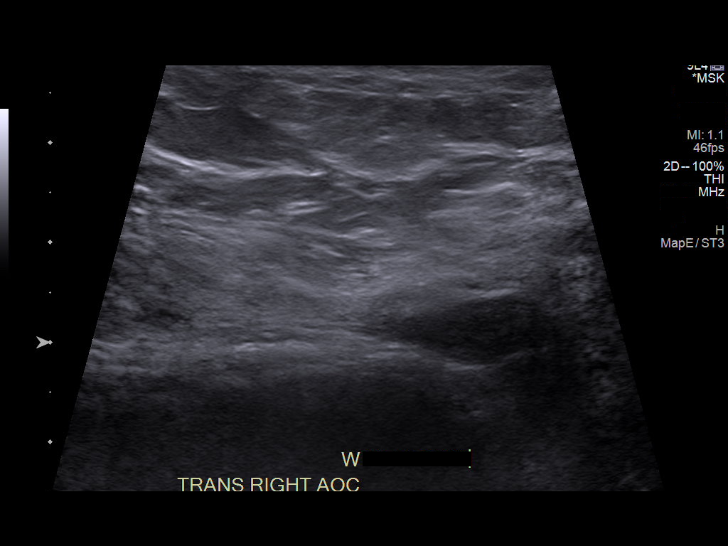

[14 of 25 positions shown; findings below may reference images not displayed]

FINDINGS: Targeted sonography of the left inguinal region performed in the
region of concern. Limited contralateral images of the right groin
also obtained for comparison. Images are obtained at rest and with
Valsalva. In the region of concern, no cystic or solid mass is seen.
There is no evidence for groin hernia.
IMPRESSION: No sonographic correlate for left groin palpable abnormality.
Further management will need to be based on clinical grounds

## 2022-02-20 IMAGING — MG MM DIGITAL SCREENING BILAT W/ TOMO AND CAD
8 series · 8 of 24 positions shown · non-contrast
Comparison: Previous exam(s).

CLINICAL DATA: Screening.

EXAM:
DIGITAL SCREENING BILATERAL MAMMOGRAM WITH TOMOSYNTHESIS AND CAD
TECHNIQUE: Bilateral screening digital craniocaudal and mediolateral oblique
mammograms were obtained. Bilateral screening digital breast
tomosynthesis was performed. The images were evaluated with
computer-aided detection.

[L CC synth-2D]
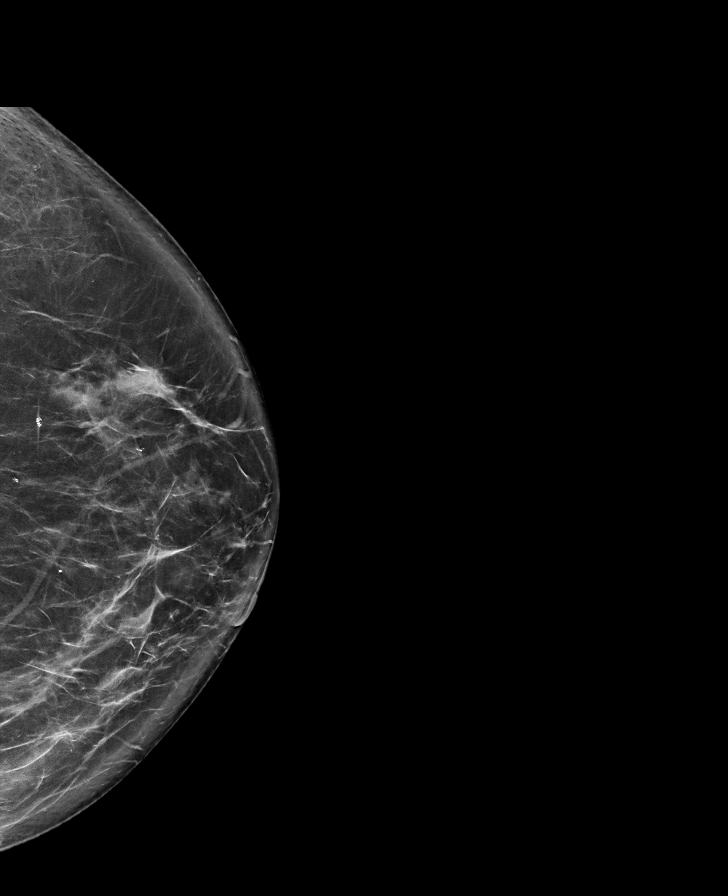

[R MLO synth-2D]
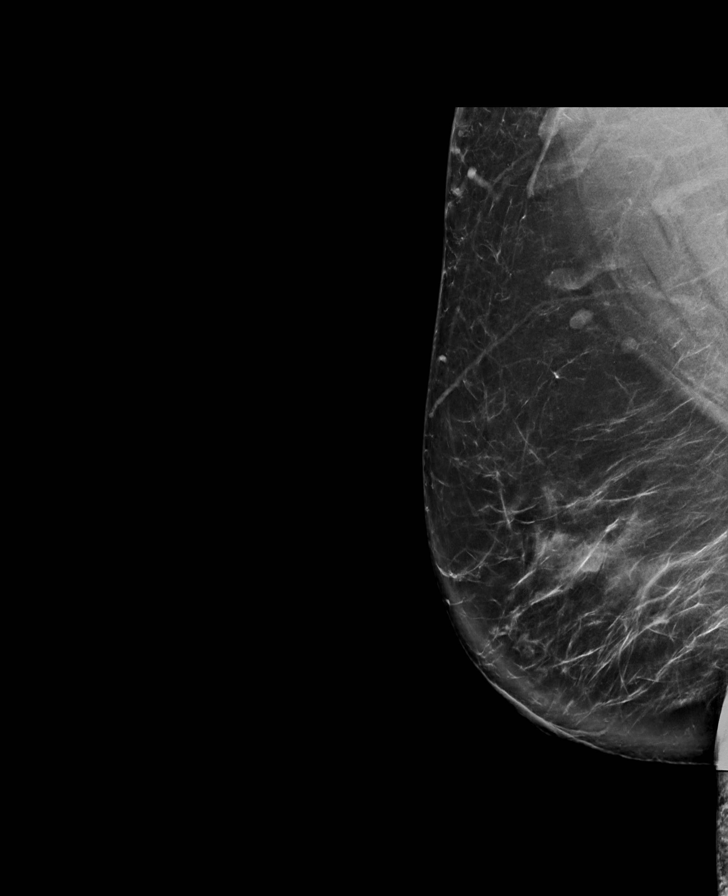

[L MLO synth-2D]
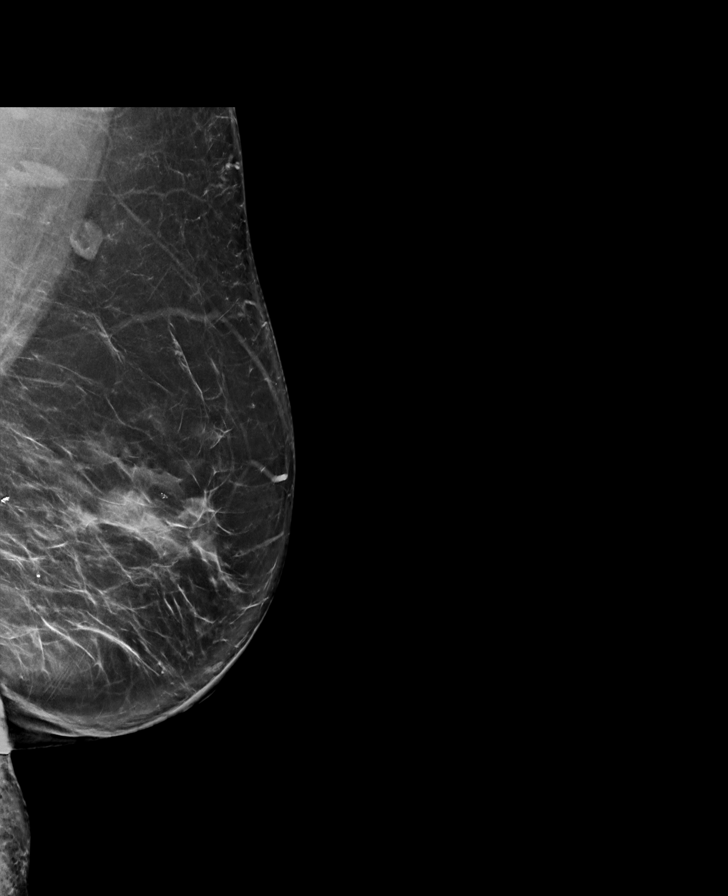

[R CC synth-2D]
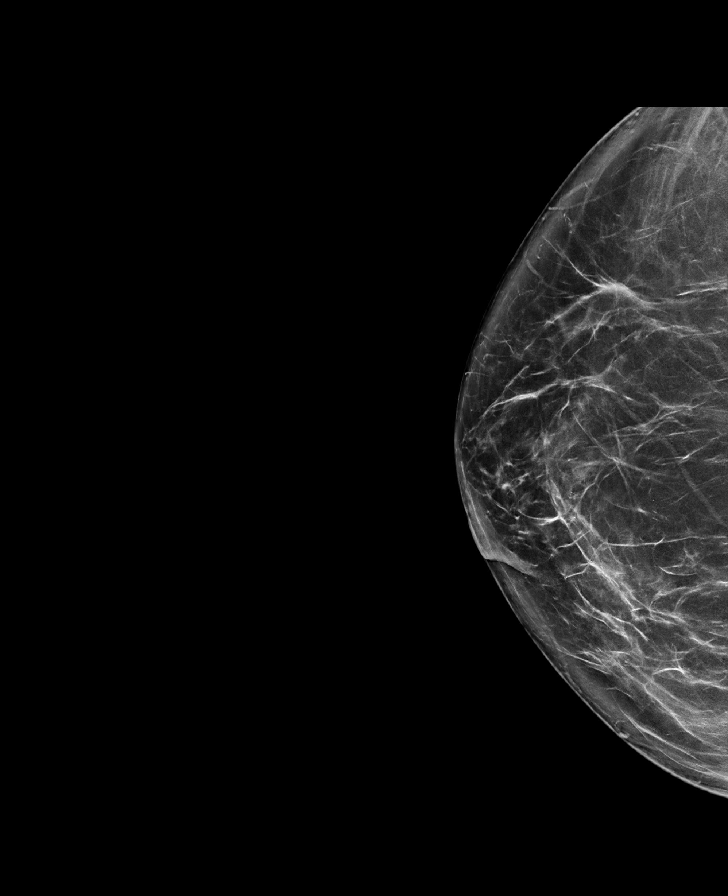

[R MLO tomo · tomo slice 49/97.0]
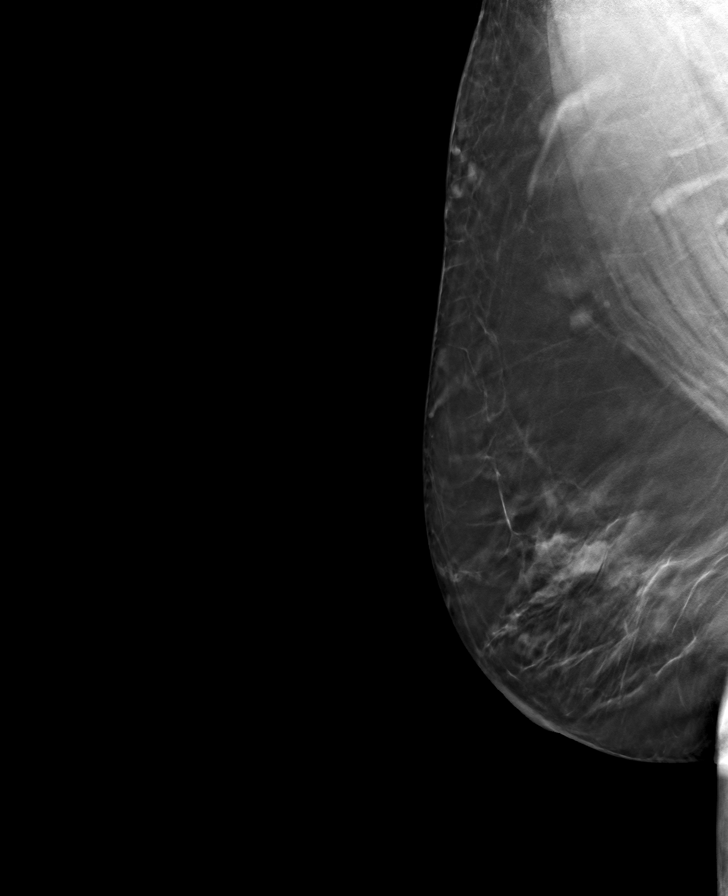

[R CC tomo · tomo slice 41/82.0]
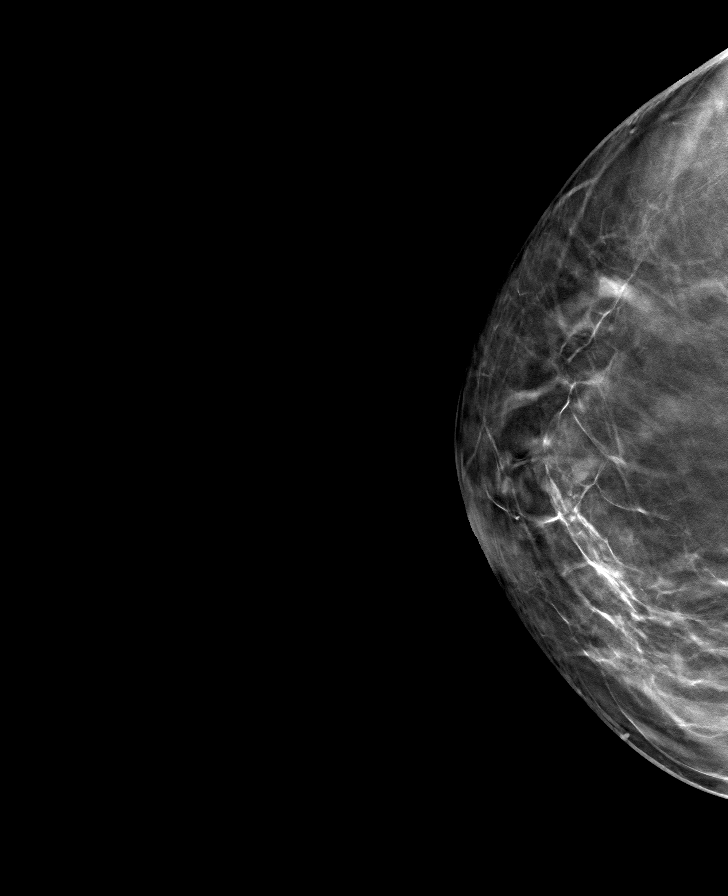

[L MLO tomo · tomo slice 46/91.0]
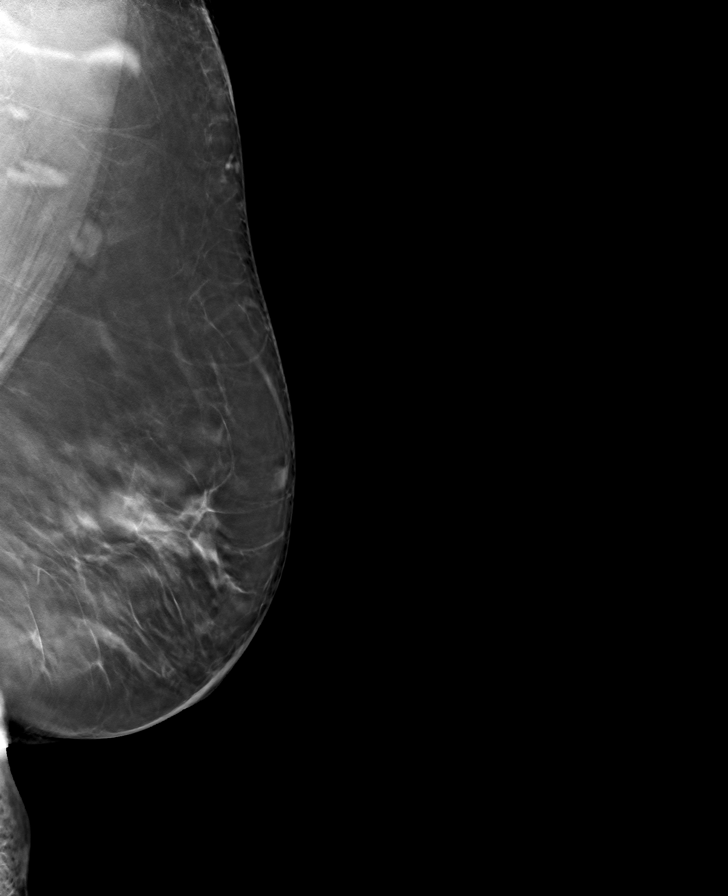

[L CC tomo · tomo slice 43/86.0]
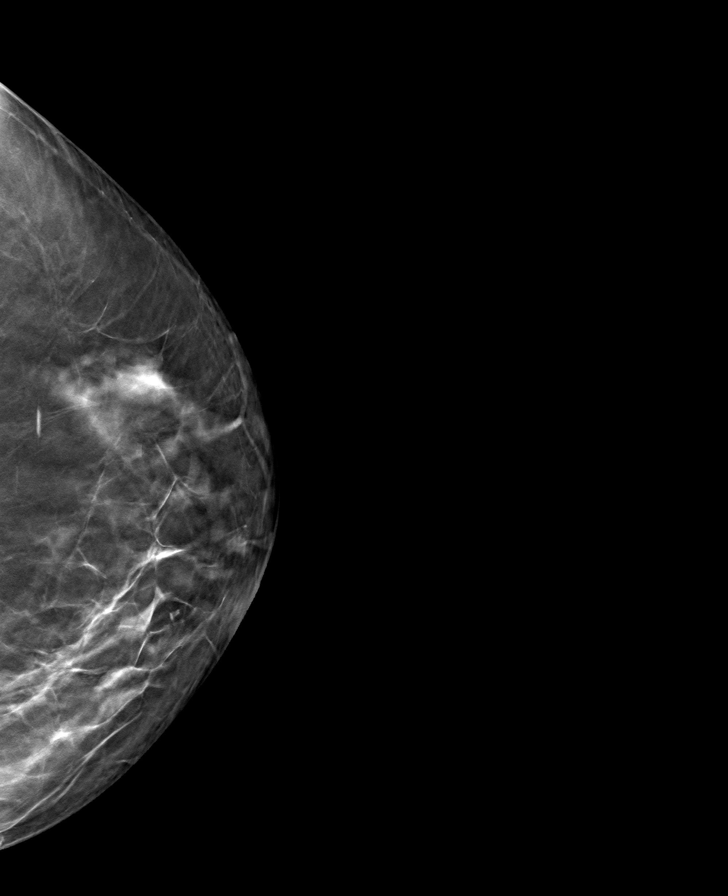

[8 of 24 positions shown; findings below may reference images not displayed]

ACR Breast Density Category b: There are scattered areas of
fibroglandular density.
FINDINGS: There are no findings suspicious for malignancy.
IMPRESSION: No mammographic evidence of malignancy. A result letter of this
screening mammogram will be mailed directly to the patient.

RECOMMENDATION:
Screening mammogram in one year. (Code:51-O-LD2)

BI-RADS CATEGORY  1: Negative.

## 2022-03-02 ENCOUNTER — Other Ambulatory Visit: Payer: Self-pay

## 2022-03-14 DIAGNOSIS — I129 Hypertensive chronic kidney disease with stage 1 through stage 4 chronic kidney disease, or unspecified chronic kidney disease: Secondary | ICD-10-CM | POA: Diagnosis not present

## 2022-03-14 DIAGNOSIS — N1831 Chronic kidney disease, stage 3a: Secondary | ICD-10-CM | POA: Diagnosis not present

## 2022-03-14 DIAGNOSIS — E1165 Type 2 diabetes mellitus with hyperglycemia: Secondary | ICD-10-CM | POA: Diagnosis not present

## 2022-03-14 DIAGNOSIS — D649 Anemia, unspecified: Secondary | ICD-10-CM | POA: Diagnosis not present

## 2022-03-14 DIAGNOSIS — M1990 Unspecified osteoarthritis, unspecified site: Secondary | ICD-10-CM | POA: Diagnosis not present

## 2022-03-14 DIAGNOSIS — F17200 Nicotine dependence, unspecified, uncomplicated: Secondary | ICD-10-CM | POA: Diagnosis not present

## 2022-03-14 DIAGNOSIS — F430 Acute stress reaction: Secondary | ICD-10-CM | POA: Diagnosis not present

## 2022-03-14 DIAGNOSIS — S12001A Unspecified nondisplaced fracture of first cervical vertebra, initial encounter for closed fracture: Secondary | ICD-10-CM | POA: Diagnosis not present

## 2022-03-14 DIAGNOSIS — E785 Hyperlipidemia, unspecified: Secondary | ICD-10-CM | POA: Diagnosis not present

## 2022-07-27 DIAGNOSIS — E785 Hyperlipidemia, unspecified: Secondary | ICD-10-CM | POA: Diagnosis not present

## 2022-07-27 DIAGNOSIS — D649 Anemia, unspecified: Secondary | ICD-10-CM | POA: Diagnosis not present

## 2022-07-27 DIAGNOSIS — E1165 Type 2 diabetes mellitus with hyperglycemia: Secondary | ICD-10-CM | POA: Diagnosis not present

## 2022-08-03 DIAGNOSIS — N1831 Chronic kidney disease, stage 3a: Secondary | ICD-10-CM | POA: Diagnosis not present

## 2022-08-03 DIAGNOSIS — M1990 Unspecified osteoarthritis, unspecified site: Secondary | ICD-10-CM | POA: Diagnosis not present

## 2022-08-03 DIAGNOSIS — E785 Hyperlipidemia, unspecified: Secondary | ICD-10-CM | POA: Diagnosis not present

## 2022-08-03 DIAGNOSIS — R82998 Other abnormal findings in urine: Secondary | ICD-10-CM | POA: Diagnosis not present

## 2022-08-03 DIAGNOSIS — E1165 Type 2 diabetes mellitus with hyperglycemia: Secondary | ICD-10-CM | POA: Diagnosis not present

## 2022-08-03 DIAGNOSIS — D649 Anemia, unspecified: Secondary | ICD-10-CM | POA: Diagnosis not present

## 2022-08-03 DIAGNOSIS — F17200 Nicotine dependence, unspecified, uncomplicated: Secondary | ICD-10-CM | POA: Diagnosis not present

## 2022-08-03 DIAGNOSIS — Z0000 Encounter for general adult medical examination without abnormal findings: Secondary | ICD-10-CM | POA: Diagnosis not present

## 2022-08-03 DIAGNOSIS — S12001A Unspecified nondisplaced fracture of first cervical vertebra, initial encounter for closed fracture: Secondary | ICD-10-CM | POA: Diagnosis not present

## 2022-08-03 DIAGNOSIS — I129 Hypertensive chronic kidney disease with stage 1 through stage 4 chronic kidney disease, or unspecified chronic kidney disease: Secondary | ICD-10-CM | POA: Diagnosis not present

## 2022-10-16 DIAGNOSIS — E119 Type 2 diabetes mellitus without complications: Secondary | ICD-10-CM | POA: Diagnosis not present

## 2022-10-16 DIAGNOSIS — Z961 Presence of intraocular lens: Secondary | ICD-10-CM | POA: Diagnosis not present

## 2022-10-16 DIAGNOSIS — H524 Presbyopia: Secondary | ICD-10-CM | POA: Diagnosis not present

## 2022-12-03 DIAGNOSIS — E785 Hyperlipidemia, unspecified: Secondary | ICD-10-CM | POA: Diagnosis not present

## 2022-12-03 DIAGNOSIS — E1165 Type 2 diabetes mellitus with hyperglycemia: Secondary | ICD-10-CM | POA: Diagnosis not present

## 2022-12-03 DIAGNOSIS — S12001A Unspecified nondisplaced fracture of first cervical vertebra, initial encounter for closed fracture: Secondary | ICD-10-CM | POA: Diagnosis not present

## 2022-12-03 DIAGNOSIS — N1831 Chronic kidney disease, stage 3a: Secondary | ICD-10-CM | POA: Diagnosis not present

## 2022-12-03 DIAGNOSIS — D649 Anemia, unspecified: Secondary | ICD-10-CM | POA: Diagnosis not present

## 2022-12-03 DIAGNOSIS — Z794 Long term (current) use of insulin: Secondary | ICD-10-CM | POA: Diagnosis not present

## 2022-12-03 DIAGNOSIS — M1990 Unspecified osteoarthritis, unspecified site: Secondary | ICD-10-CM | POA: Diagnosis not present

## 2022-12-03 DIAGNOSIS — F17200 Nicotine dependence, unspecified, uncomplicated: Secondary | ICD-10-CM | POA: Diagnosis not present

## 2022-12-03 DIAGNOSIS — I129 Hypertensive chronic kidney disease with stage 1 through stage 4 chronic kidney disease, or unspecified chronic kidney disease: Secondary | ICD-10-CM | POA: Diagnosis not present

## 2022-12-03 DIAGNOSIS — Z780 Asymptomatic menopausal state: Secondary | ICD-10-CM | POA: Diagnosis not present

## 2023-04-01 ENCOUNTER — Other Ambulatory Visit: Payer: Self-pay | Admitting: Internal Medicine

## 2023-04-01 DIAGNOSIS — Z0000 Encounter for general adult medical examination without abnormal findings: Secondary | ICD-10-CM

## 2023-04-17 ENCOUNTER — Ambulatory Visit
Admission: RE | Admit: 2023-04-17 | Discharge: 2023-04-17 | Disposition: A | Discharge status: Home / Self Care | Payer: Medicare HMO | Source: Ambulatory Visit | Attending: Internal Medicine | Admitting: Internal Medicine

## 2023-04-17 ENCOUNTER — Ambulatory Visit: Payer: Medicare HMO

## 2023-04-17 DIAGNOSIS — Z794 Long term (current) use of insulin: Secondary | ICD-10-CM | POA: Diagnosis not present

## 2023-04-17 DIAGNOSIS — Z0000 Encounter for general adult medical examination without abnormal findings: Secondary | ICD-10-CM

## 2023-04-17 DIAGNOSIS — E1165 Type 2 diabetes mellitus with hyperglycemia: Secondary | ICD-10-CM | POA: Diagnosis not present

## 2023-04-17 DIAGNOSIS — D649 Anemia, unspecified: Secondary | ICD-10-CM | POA: Diagnosis not present

## 2023-04-17 DIAGNOSIS — F430 Acute stress reaction: Secondary | ICD-10-CM | POA: Diagnosis not present

## 2023-04-17 DIAGNOSIS — E785 Hyperlipidemia, unspecified: Secondary | ICD-10-CM | POA: Diagnosis not present

## 2023-04-17 DIAGNOSIS — N1831 Chronic kidney disease, stage 3a: Secondary | ICD-10-CM | POA: Diagnosis not present

## 2023-04-17 DIAGNOSIS — F17200 Nicotine dependence, unspecified, uncomplicated: Secondary | ICD-10-CM | POA: Diagnosis not present

## 2023-04-17 DIAGNOSIS — I129 Hypertensive chronic kidney disease with stage 1 through stage 4 chronic kidney disease, or unspecified chronic kidney disease: Secondary | ICD-10-CM | POA: Diagnosis not present

## 2023-04-17 DIAGNOSIS — Z1231 Encounter for screening mammogram for malignant neoplasm of breast: Secondary | ICD-10-CM | POA: Diagnosis not present

## 2023-04-26 DIAGNOSIS — H43812 Vitreous degeneration, left eye: Secondary | ICD-10-CM | POA: Diagnosis not present

## 2023-04-26 DIAGNOSIS — H3500 Unspecified background retinopathy: Secondary | ICD-10-CM | POA: Diagnosis not present

## 2023-04-30 DIAGNOSIS — Z961 Presence of intraocular lens: Secondary | ICD-10-CM | POA: Diagnosis not present

## 2023-04-30 DIAGNOSIS — E113393 Type 2 diabetes mellitus with moderate nonproliferative diabetic retinopathy without macular edema, bilateral: Secondary | ICD-10-CM | POA: Diagnosis not present

## 2023-04-30 DIAGNOSIS — H35033 Hypertensive retinopathy, bilateral: Secondary | ICD-10-CM | POA: Diagnosis not present

## 2023-04-30 DIAGNOSIS — H43812 Vitreous degeneration, left eye: Secondary | ICD-10-CM | POA: Diagnosis not present

## 2023-04-30 DIAGNOSIS — H43821 Vitreomacular adhesion, right eye: Secondary | ICD-10-CM | POA: Diagnosis not present

## 2023-06-04 DIAGNOSIS — E113393 Type 2 diabetes mellitus with moderate nonproliferative diabetic retinopathy without macular edema, bilateral: Secondary | ICD-10-CM | POA: Diagnosis not present

## 2023-06-04 DIAGNOSIS — H43812 Vitreous degeneration, left eye: Secondary | ICD-10-CM | POA: Diagnosis not present

## 2023-06-04 DIAGNOSIS — Z961 Presence of intraocular lens: Secondary | ICD-10-CM | POA: Diagnosis not present

## 2023-06-04 DIAGNOSIS — H35033 Hypertensive retinopathy, bilateral: Secondary | ICD-10-CM | POA: Diagnosis not present

## 2023-06-04 DIAGNOSIS — H43821 Vitreomacular adhesion, right eye: Secondary | ICD-10-CM | POA: Diagnosis not present

## 2023-08-09 DIAGNOSIS — E785 Hyperlipidemia, unspecified: Secondary | ICD-10-CM | POA: Diagnosis not present

## 2023-08-09 DIAGNOSIS — E1165 Type 2 diabetes mellitus with hyperglycemia: Secondary | ICD-10-CM | POA: Diagnosis not present

## 2023-08-09 DIAGNOSIS — I129 Hypertensive chronic kidney disease with stage 1 through stage 4 chronic kidney disease, or unspecified chronic kidney disease: Secondary | ICD-10-CM | POA: Diagnosis not present

## 2023-08-09 DIAGNOSIS — D649 Anemia, unspecified: Secondary | ICD-10-CM | POA: Diagnosis not present

## 2023-08-09 DIAGNOSIS — N1831 Chronic kidney disease, stage 3a: Secondary | ICD-10-CM | POA: Diagnosis not present

## 2023-08-09 DIAGNOSIS — Z1212 Encounter for screening for malignant neoplasm of rectum: Secondary | ICD-10-CM | POA: Diagnosis not present

## 2023-08-16 DIAGNOSIS — N1831 Chronic kidney disease, stage 3a: Secondary | ICD-10-CM | POA: Diagnosis not present

## 2023-08-16 DIAGNOSIS — Z0000 Encounter for general adult medical examination without abnormal findings: Secondary | ICD-10-CM | POA: Diagnosis not present

## 2023-08-16 DIAGNOSIS — F17200 Nicotine dependence, unspecified, uncomplicated: Secondary | ICD-10-CM | POA: Diagnosis not present

## 2023-08-16 DIAGNOSIS — E038 Other specified hypothyroidism: Secondary | ICD-10-CM | POA: Diagnosis not present

## 2023-08-16 DIAGNOSIS — F430 Acute stress reaction: Secondary | ICD-10-CM | POA: Diagnosis not present

## 2023-08-16 DIAGNOSIS — D649 Anemia, unspecified: Secondary | ICD-10-CM | POA: Diagnosis not present

## 2023-08-16 DIAGNOSIS — I129 Hypertensive chronic kidney disease with stage 1 through stage 4 chronic kidney disease, or unspecified chronic kidney disease: Secondary | ICD-10-CM | POA: Diagnosis not present

## 2023-08-16 DIAGNOSIS — Z1339 Encounter for screening examination for other mental health and behavioral disorders: Secondary | ICD-10-CM | POA: Diagnosis not present

## 2023-08-16 DIAGNOSIS — Z794 Long term (current) use of insulin: Secondary | ICD-10-CM | POA: Diagnosis not present

## 2023-08-16 DIAGNOSIS — Z1331 Encounter for screening for depression: Secondary | ICD-10-CM | POA: Diagnosis not present

## 2023-08-16 DIAGNOSIS — E785 Hyperlipidemia, unspecified: Secondary | ICD-10-CM | POA: Diagnosis not present

## 2023-08-16 DIAGNOSIS — E1165 Type 2 diabetes mellitus with hyperglycemia: Secondary | ICD-10-CM | POA: Diagnosis not present

## 2023-09-17 DIAGNOSIS — Z961 Presence of intraocular lens: Secondary | ICD-10-CM | POA: Diagnosis not present

## 2023-09-17 DIAGNOSIS — E113392 Type 2 diabetes mellitus with moderate nonproliferative diabetic retinopathy without macular edema, left eye: Secondary | ICD-10-CM | POA: Diagnosis not present

## 2023-09-17 DIAGNOSIS — E113491 Type 2 diabetes mellitus with severe nonproliferative diabetic retinopathy without macular edema, right eye: Secondary | ICD-10-CM | POA: Diagnosis not present

## 2023-09-17 DIAGNOSIS — H43821 Vitreomacular adhesion, right eye: Secondary | ICD-10-CM | POA: Diagnosis not present

## 2023-09-17 DIAGNOSIS — H43812 Vitreous degeneration, left eye: Secondary | ICD-10-CM | POA: Diagnosis not present

## 2023-09-17 DIAGNOSIS — H35033 Hypertensive retinopathy, bilateral: Secondary | ICD-10-CM | POA: Diagnosis not present

## 2023-09-25 DIAGNOSIS — I6523 Occlusion and stenosis of bilateral carotid arteries: Secondary | ICD-10-CM | POA: Diagnosis not present

## 2023-09-25 DIAGNOSIS — E042 Nontoxic multinodular goiter: Secondary | ICD-10-CM | POA: Diagnosis not present

## 2023-09-25 DIAGNOSIS — E113491 Type 2 diabetes mellitus with severe nonproliferative diabetic retinopathy without macular edema, right eye: Secondary | ICD-10-CM | POA: Diagnosis not present

## 2023-11-05 DIAGNOSIS — H43821 Vitreomacular adhesion, right eye: Secondary | ICD-10-CM | POA: Diagnosis not present

## 2023-11-05 DIAGNOSIS — H35033 Hypertensive retinopathy, bilateral: Secondary | ICD-10-CM | POA: Diagnosis not present

## 2023-11-05 DIAGNOSIS — E113493 Type 2 diabetes mellitus with severe nonproliferative diabetic retinopathy without macular edema, bilateral: Secondary | ICD-10-CM | POA: Diagnosis not present

## 2023-11-05 DIAGNOSIS — H43812 Vitreous degeneration, left eye: Secondary | ICD-10-CM | POA: Diagnosis not present

## 2023-11-05 DIAGNOSIS — Z961 Presence of intraocular lens: Secondary | ICD-10-CM | POA: Diagnosis not present

## 2023-12-16 DIAGNOSIS — E038 Other specified hypothyroidism: Secondary | ICD-10-CM | POA: Diagnosis not present

## 2023-12-16 DIAGNOSIS — F430 Acute stress reaction: Secondary | ICD-10-CM | POA: Diagnosis not present

## 2023-12-16 DIAGNOSIS — E1165 Type 2 diabetes mellitus with hyperglycemia: Secondary | ICD-10-CM | POA: Diagnosis not present

## 2023-12-16 DIAGNOSIS — N1831 Chronic kidney disease, stage 3a: Secondary | ICD-10-CM | POA: Diagnosis not present

## 2023-12-16 DIAGNOSIS — Z794 Long term (current) use of insulin: Secondary | ICD-10-CM | POA: Diagnosis not present

## 2023-12-16 DIAGNOSIS — F17200 Nicotine dependence, unspecified, uncomplicated: Secondary | ICD-10-CM | POA: Diagnosis not present

## 2023-12-16 DIAGNOSIS — N183 Chronic kidney disease, stage 3 unspecified: Secondary | ICD-10-CM | POA: Diagnosis not present

## 2023-12-16 DIAGNOSIS — I129 Hypertensive chronic kidney disease with stage 1 through stage 4 chronic kidney disease, or unspecified chronic kidney disease: Secondary | ICD-10-CM | POA: Diagnosis not present

## 2023-12-16 DIAGNOSIS — D649 Anemia, unspecified: Secondary | ICD-10-CM | POA: Diagnosis not present

## 2023-12-19 ENCOUNTER — Encounter: Payer: Self-pay | Admitting: Internal Medicine

## 2023-12-19 ENCOUNTER — Other Ambulatory Visit: Payer: Self-pay | Admitting: Internal Medicine

## 2023-12-19 DIAGNOSIS — R799 Abnormal finding of blood chemistry, unspecified: Secondary | ICD-10-CM

## 2023-12-19 DIAGNOSIS — N1831 Chronic kidney disease, stage 3a: Secondary | ICD-10-CM

## 2023-12-19 DIAGNOSIS — I129 Hypertensive chronic kidney disease with stage 1 through stage 4 chronic kidney disease, or unspecified chronic kidney disease: Secondary | ICD-10-CM

## 2023-12-20 DIAGNOSIS — E042 Nontoxic multinodular goiter: Secondary | ICD-10-CM | POA: Diagnosis not present

## 2023-12-20 DIAGNOSIS — I129 Hypertensive chronic kidney disease with stage 1 through stage 4 chronic kidney disease, or unspecified chronic kidney disease: Secondary | ICD-10-CM | POA: Diagnosis not present

## 2023-12-20 DIAGNOSIS — N1831 Chronic kidney disease, stage 3a: Secondary | ICD-10-CM | POA: Diagnosis not present

## 2023-12-20 DIAGNOSIS — N838 Other noninflammatory disorders of ovary, fallopian tube and broad ligament: Secondary | ICD-10-CM | POA: Diagnosis not present

## 2023-12-27 ENCOUNTER — Other Ambulatory Visit

## 2023-12-31 DIAGNOSIS — I129 Hypertensive chronic kidney disease with stage 1 through stage 4 chronic kidney disease, or unspecified chronic kidney disease: Secondary | ICD-10-CM | POA: Diagnosis not present

## 2023-12-31 DIAGNOSIS — E1165 Type 2 diabetes mellitus with hyperglycemia: Secondary | ICD-10-CM | POA: Diagnosis not present

## 2023-12-31 DIAGNOSIS — E038 Other specified hypothyroidism: Secondary | ICD-10-CM | POA: Diagnosis not present

## 2023-12-31 DIAGNOSIS — E785 Hyperlipidemia, unspecified: Secondary | ICD-10-CM | POA: Diagnosis not present

## 2023-12-31 DIAGNOSIS — N1831 Chronic kidney disease, stage 3a: Secondary | ICD-10-CM | POA: Diagnosis not present

## 2024-01-17 DIAGNOSIS — D631 Anemia in chronic kidney disease: Secondary | ICD-10-CM | POA: Diagnosis not present

## 2024-01-17 DIAGNOSIS — N179 Acute kidney failure, unspecified: Secondary | ICD-10-CM | POA: Diagnosis not present

## 2024-01-17 DIAGNOSIS — N1831 Chronic kidney disease, stage 3a: Secondary | ICD-10-CM | POA: Diagnosis not present

## 2024-01-17 DIAGNOSIS — I129 Hypertensive chronic kidney disease with stage 1 through stage 4 chronic kidney disease, or unspecified chronic kidney disease: Secondary | ICD-10-CM | POA: Diagnosis not present

## 2024-01-17 DIAGNOSIS — N184 Chronic kidney disease, stage 4 (severe): Secondary | ICD-10-CM | POA: Diagnosis not present

## 2024-01-17 DIAGNOSIS — N2581 Secondary hyperparathyroidism of renal origin: Secondary | ICD-10-CM | POA: Diagnosis not present

## 2024-02-06 DIAGNOSIS — R21 Rash and other nonspecific skin eruption: Secondary | ICD-10-CM | POA: Diagnosis not present

## 2024-02-06 DIAGNOSIS — L299 Pruritus, unspecified: Secondary | ICD-10-CM | POA: Diagnosis not present

## 2024-02-06 DIAGNOSIS — E1165 Type 2 diabetes mellitus with hyperglycemia: Secondary | ICD-10-CM | POA: Diagnosis not present

## 2024-02-06 DIAGNOSIS — I129 Hypertensive chronic kidney disease with stage 1 through stage 4 chronic kidney disease, or unspecified chronic kidney disease: Secondary | ICD-10-CM | POA: Diagnosis not present

## 2024-02-22 DIAGNOSIS — R404 Transient alteration of awareness: Secondary | ICD-10-CM | POA: Diagnosis not present

## 2024-02-22 DIAGNOSIS — E161 Other hypoglycemia: Secondary | ICD-10-CM | POA: Diagnosis not present

## 2024-02-22 DIAGNOSIS — R61 Generalized hyperhidrosis: Secondary | ICD-10-CM | POA: Diagnosis not present

## 2024-02-24 DIAGNOSIS — D631 Anemia in chronic kidney disease: Secondary | ICD-10-CM | POA: Diagnosis not present

## 2024-02-24 DIAGNOSIS — N1831 Chronic kidney disease, stage 3a: Secondary | ICD-10-CM | POA: Diagnosis not present

## 2024-02-24 DIAGNOSIS — I129 Hypertensive chronic kidney disease with stage 1 through stage 4 chronic kidney disease, or unspecified chronic kidney disease: Secondary | ICD-10-CM | POA: Diagnosis not present

## 2024-02-24 DIAGNOSIS — N179 Acute kidney failure, unspecified: Secondary | ICD-10-CM | POA: Diagnosis not present

## 2024-02-24 DIAGNOSIS — N2581 Secondary hyperparathyroidism of renal origin: Secondary | ICD-10-CM | POA: Diagnosis not present

## 2024-02-27 ENCOUNTER — Other Ambulatory Visit (HOSPITAL_COMMUNITY): Payer: Self-pay | Admitting: Nephrology

## 2024-02-27 DIAGNOSIS — N179 Acute kidney failure, unspecified: Secondary | ICD-10-CM

## 2024-03-02 DIAGNOSIS — H35033 Hypertensive retinopathy, bilateral: Secondary | ICD-10-CM | POA: Diagnosis not present

## 2024-03-02 DIAGNOSIS — H43812 Vitreous degeneration, left eye: Secondary | ICD-10-CM | POA: Diagnosis not present

## 2024-03-02 DIAGNOSIS — E113493 Type 2 diabetes mellitus with severe nonproliferative diabetic retinopathy without macular edema, bilateral: Secondary | ICD-10-CM | POA: Diagnosis not present

## 2024-03-02 DIAGNOSIS — H43821 Vitreomacular adhesion, right eye: Secondary | ICD-10-CM | POA: Diagnosis not present

## 2024-03-02 DIAGNOSIS — Z961 Presence of intraocular lens: Secondary | ICD-10-CM | POA: Diagnosis not present

## 2024-03-18 ENCOUNTER — Other Ambulatory Visit: Payer: Self-pay | Admitting: Student

## 2024-03-18 ENCOUNTER — Other Ambulatory Visit (HOSPITAL_COMMUNITY): Payer: Self-pay | Admitting: Student

## 2024-03-18 ENCOUNTER — Other Ambulatory Visit: Payer: Self-pay

## 2024-03-18 ENCOUNTER — Other Ambulatory Visit: Payer: Self-pay | Admitting: Radiology

## 2024-03-18 DIAGNOSIS — N179 Acute kidney failure, unspecified: Secondary | ICD-10-CM

## 2024-03-18 NOTE — H&P (Incomplete)
 Chief Complaint: Patient was seen in consultation today for chronic kidney disease  Referring Physician(s): Patel,Jay K  Supervising Physician: Philip Cornet  Patient Status: [facility] - Out-pt  History of Present Illness: Robin Duran is a 73 y.o. female with a medical history significant for CAD, DM2, HTN, obesity, anxiety/depression and chronic kidney disease. She was referred to nephrology last year for increasing creatinine levels. Work up including labs and imaging were unrevealing. Recent labs show a further increase in her creatinine level to 2.84.   Interventional Radiology has been asked to evaluate this patient for an image-guided non-focal renal biopsy for further work up.   Past Medical History:  Diagnosis Date   Cataract    Diabetes mellitus without complication (HCC)    Extremity edema    Heart murmur    Hyperlipidemia    Hypertension     Past Surgical History:  Procedure Laterality Date   CATARACT EXTRACTION, BILATERAL     COLONOSCOPY     DILATION AND CURETTAGE OF UTERUS     miscarriage    Allergies: Patient has no known allergies.  Medications: Prior to Admission medications   Medication Sig Start Date End Date Taking? Authorizing Provider  aspirin 81 MG chewable tablet Chew by mouth daily.    [provider]  clindamycin  (CLEOCIN ) 300 MG capsule take 1 q 12 h 08/28/21     escitalopram  (LEXAPRO ) 10 MG tablet TAKE 1 TABLET BY MOUTH ONCE A DAY 10/18/20 10/18/21  Avva, Ravisankar, MD  felodipine  (PLENDIL ) 10 MG 24 hr tablet Take 10 mg by mouth daily.    [provider]  felodipine  (PLENDIL ) 10 MG 24 hr tablet TAKE 1 TABLET BY MOUTH ONCE DAILY. 08/09/20 08/09/21  Nichole Senior, MD  furosemide  (LASIX ) 40 MG tablet Take 40 mg by mouth.    [provider]  furosemide  (LASIX ) 80 MG tablet TAKE 1/2 TABLET BY MOUTH DAILY 12/10/19 12/09/20  Avva, Ravisankar, MD  furosemide  (LASIX ) 80 MG tablet Take 0.5 tablets (40 mg total) by mouth daily.  07/04/21     glyBURIDE -metformin  (GLUCOVANCE ) 2.5-500 MG tablet Take 2 tablets by mouth daily with breakfast. 2 in am 1 in pm    [provider]  glyBURIDE -metformin  (GLUCOVANCE ) 2.5-500 MG tablet TAKE 2 TABLETS BY MOUTH TWICE A DAY 06/22/20 09/10/21  Avva, Ravisankar, MD  insulin aspart protamine- aspart (NOVOLOG MIX 70/30) (70-30) 100 UNIT/ML injection Inject 30 Units into the skin. 30 u in am 60 u in pm    [provider]  Insulin Lispro Prot & Lispro (HUMALOG 75/25 MIX) (75-25) 100 UNIT/ML Kwikpen INJECT 70 UNITS INTO THE SKIN IN THE MORNING AND 60 UNITS IN THE EVENING. 02/10/20 02/09/21  Nichole Senior, MD  lisinopril  (PRINIVIL ,ZESTRIL ) 40 MG tablet Take 40 mg by mouth daily.    [provider]  nebivolol  (BYSTOLIC ) 5 MG tablet TAKE 1 TABLET BY MOUTH ONCE A DAY IN THE MORNING 07/11/20 07/11/21  Avva, Ravisankar, MD  nebivolol  (BYSTOLIC ) 5 MG tablet Take 1 tablet (5 mg total) by mouth daily. 04/05/21     olmesartan  (BENICAR ) 40 MG tablet Take 1 tablet (40 mg total) by mouth daily for blood pressure for 7 days. 12/04/21 12/11/21    potassium chloride  SA (K-DUR,KLOR-CON ) 20 MEQ tablet Take 20 mEq by mouth 2 (two) times daily.    [provider]  potassium chloride  SA (KLOR-CON ) 20 MEQ tablet Take 0.5 tablets (10 mEq total) by mouth daily. 03/23/21     simvastatin  (ZOCOR ) 40 MG  tablet Take 40 mg by mouth daily.    [provider]  simvastatin  (ZOCOR ) 40 MG tablet TAKE 1 TABLET BY MOUTH ONCE A DAY 06/29/20 06/29/21  Avva, Ravisankar, MD  simvastatin  (ZOCOR ) 40 MG tablet Take 1 tablet (40 mg total) by mouth daily. 07/04/21     telmisartan  (MICARDIS ) 80 MG tablet Take 1 tablet (80 mg total) by mouth daily. 06/12/21     vitamin B-12 (CYANOCOBALAMIN) 1000 MCG tablet Take 1,000 mcg by mouth daily.    [provider]     Family History  Problem Relation Age of Onset   Diabetes Mother    Heart disease Father    Colon polyps Sister    Diabetes Sister     Heart disease Sister    Colon polyps Brother    Diabetes Brother    Colon cancer Neg Hx    Breast cancer Neg Hx     Social History   Socioeconomic History   Marital status: Married    Spouse name: Not on file   Number of children: Not on file   Years of education: Not on file   Highest education level: Not on file  Occupational History   Not on file  Tobacco Use   Smoking status: Some Days   Smokeless tobacco: Never  Substance and Sexual Activity   Alcohol  use: Yes    Alcohol /week: 14.0 standard drinks of alcohol     Types: 14 Cans of beer per week   Drug use: No   Sexual activity: Not on file  Other Topics Concern   Not on file  Social History Narrative   Not on file   Social Drivers of Health   Financial Resource Strain: Not on file  Food Insecurity: Not on file  Transportation Needs: Not on file  Physical Activity: Not on file  Stress: Not on file  Social Connections: Not on file    Review of Systems: A 12 point ROS discussed and pertinent positives are indicated in the HPI above.  All other systems are negative.  Review of Systems  Vital Signs: There were no vitals taken for this visit.  Physical Exam  Imaging: No results found.  Labs:  CBC: No results for input(s): WBC, HGB, HCT, PLT in the last 8760 hours.  COAGS: No results for input(s): INR, APTT in the last 8760 hours.  BMP: No results for input(s): NA, K, CL, CO2, GLUCOSE, BUN, CALCIUM, CREATININE, GFRNONAA, GFRAA in the last 8760 hours.  Invalid input(s): CMP  LIVER FUNCTION TESTS: No results for input(s): BILITOT, AST, ALT, ALKPHOS, PROT, ALBUMIN in the last 8760 hours.  TUMOR MARKERS: No results for input(s): AFPTM, CEA, CA199, CHROMGRNA in the last 8760 hours.  Assessment and Plan:  Chronic kidney disease: Robin Duran, 73 year old female, presents today to the [facility] Interventional Radiology department for an  image-guided non-focal renal biopsy.   Risks and benefits of this procedure were discussed with the patient and/or patient's family including, but not limited to bleeding, infection, damage to adjacent structures or low yield requiring additional tests.  All of the questions were answered and there is agreement to proceed. She has been NPO. She is a full code. Her daily 81 mg aspirin has been held the appropriate length of time.   Consent signed and in chart.  Thank you for this interesting consult.  I greatly enjoyed meeting Ilya VALERI SULA and look forward to participating in their care.  A copy of this report was sent  to the requesting provider on this date.  Electronically Signed: Warren Dais, AGACNP-BC 03/18/2024, 8:57 AM   I spent a total of  30 Minutes   in face to face in clinical consultation, greater than 50% of which was counseling/coordinating care for chronic kidney disease.

## 2024-03-19 ENCOUNTER — Ambulatory Visit (HOSPITAL_COMMUNITY)
Admission: RE | Admit: 2024-03-19 | Discharge: 2024-03-19 | Disposition: A | Discharge status: Home / Self Care | Source: Ambulatory Visit | Attending: Nephrology | Admitting: Nephrology

## 2024-03-19 ENCOUNTER — Other Ambulatory Visit: Payer: Self-pay

## 2024-03-19 DIAGNOSIS — Z794 Long term (current) use of insulin: Secondary | ICD-10-CM | POA: Diagnosis not present

## 2024-03-19 DIAGNOSIS — I129 Hypertensive chronic kidney disease with stage 1 through stage 4 chronic kidney disease, or unspecified chronic kidney disease: Secondary | ICD-10-CM | POA: Diagnosis not present

## 2024-03-19 DIAGNOSIS — F418 Other specified anxiety disorders: Secondary | ICD-10-CM | POA: Insufficient documentation

## 2024-03-19 DIAGNOSIS — I2510 Atherosclerotic heart disease of native coronary artery without angina pectoris: Secondary | ICD-10-CM | POA: Insufficient documentation

## 2024-03-19 DIAGNOSIS — Z7984 Long term (current) use of oral hypoglycemic drugs: Secondary | ICD-10-CM | POA: Diagnosis not present

## 2024-03-19 DIAGNOSIS — N1831 Chronic kidney disease, stage 3a: Secondary | ICD-10-CM | POA: Diagnosis not present

## 2024-03-19 DIAGNOSIS — E669 Obesity, unspecified: Secondary | ICD-10-CM | POA: Diagnosis not present

## 2024-03-19 DIAGNOSIS — E1122 Type 2 diabetes mellitus with diabetic chronic kidney disease: Secondary | ICD-10-CM | POA: Insufficient documentation

## 2024-03-19 DIAGNOSIS — N179 Acute kidney failure, unspecified: Secondary | ICD-10-CM | POA: Diagnosis not present

## 2024-03-19 LAB — GLUCOSE, CAPILLARY
Glucose-Capillary: 119 mg/dL — ABNORMAL HIGH (ref 70–99)
Glucose-Capillary: 123 mg/dL — ABNORMAL HIGH (ref 70–99)

## 2024-03-19 LAB — PROTIME-INR
INR: 1.1 (ref 0.8–1.2)
Prothrombin Time: 15.2 s (ref 11.4–15.2)

## 2024-03-19 LAB — CBC
HCT: 29.7 % — ABNORMAL LOW (ref 36.0–46.0)
Hemoglobin: 9.4 g/dL — ABNORMAL LOW (ref 12.0–15.0)
MCH: 28.1 pg (ref 26.0–34.0)
MCHC: 31.6 g/dL (ref 30.0–36.0)
MCV: 88.9 fL (ref 80.0–100.0)
Platelets: 180 K/uL (ref 150–400)
RBC: 3.34 MIL/uL — ABNORMAL LOW (ref 3.87–5.11)
RDW: 12.8 % (ref 11.5–15.5)
WBC: 7.8 K/uL (ref 4.0–10.5)
nRBC: 0 % (ref 0.0–0.2)

## 2024-03-19 MED ORDER — SODIUM CHLORIDE 0.9 % IV SOLN: INTRAVENOUS | Status: DC

## 2024-03-19 MED ORDER — MIDAZOLAM HCL 2 MG/2ML IJ SOLN
INTRAMUSCULAR | Status: AC
  Filled 2024-03-19: qty 4

## 2024-03-19 MED ORDER — FENTANYL CITRATE (PF) 100 MCG/2ML IJ SOLN
INTRAMUSCULAR | Status: AC
  Filled 2024-03-19: qty 2

## 2024-03-19 MED ORDER — HYDRALAZINE HCL 20 MG/ML IJ SOLN
INTRAMUSCULAR | Status: AC
  Filled 2024-03-19: qty 1

## 2024-03-19 MED ORDER — NEBIVOLOL HCL 5 MG PO TABS
5.0000 mg | ORAL_TABLET | ORAL | Status: AC
  Administered 2024-03-19: 5 mg via ORAL
  Filled 2024-03-19: qty 1

## 2024-03-19 MED ORDER — MIDAZOLAM HCL 2 MG/2ML IJ SOLN
INTRAMUSCULAR | Status: AC | PRN
  Administered 2024-03-19 (×3): 1 mg via INTRAVENOUS

## 2024-03-19 MED ORDER — LISINOPRIL 20 MG PO TABS
40.0000 mg | ORAL_TABLET | ORAL | Status: DC
  Filled 2024-03-19: qty 2

## 2024-03-19 MED ORDER — FENTANYL CITRATE (PF) 100 MCG/2ML IJ SOLN
INTRAMUSCULAR | Status: AC | PRN
  Administered 2024-03-19 (×2): 50 ug via INTRAVENOUS

## 2024-03-19 MED ORDER — HYDRALAZINE HCL 20 MG/ML IJ SOLN
10.0000 mg | Freq: Once | INTRAMUSCULAR | Status: AC
  Administered 2024-03-19: 10 mg via INTRAVENOUS

## 2024-03-19 MED ORDER — LIDOCAINE HCL (PF) 1 % IJ SOLN
10.0000 mL | Freq: Once | INTRAMUSCULAR | Status: AC
  Administered 2024-03-19: 10 mL via INTRADERMAL

## 2024-03-19 MED ORDER — IRBESARTAN 300 MG PO TABS
300.0000 mg | ORAL_TABLET | ORAL | Status: AC
  Administered 2024-03-19: 300 mg via ORAL
  Filled 2024-03-19: qty 1

## 2024-03-19 MED ORDER — GELATIN ABSORBABLE 12-7 MM EX MISC
1.0000 | Freq: Once | CUTANEOUS | Status: AC
  Administered 2024-03-19: 1 via TOPICAL

## 2024-03-19 MED ORDER — ACETAMINOPHEN 500 MG PO TABS: 500.0000 mg | ORAL_TABLET | Freq: Four times a day (QID) | ORAL | Status: DC | PRN

## 2024-03-19 NOTE — Procedures (Signed)
 Interventional Radiology Procedure:   Indications: Acute on chronic kidney disease  Procedure: US  guided left renal biopsy  Findings: 2 core biopsies from left kidney lower pole. Gelfoam injected along biopsy tract.  Complications: None     EBL: Minimal  Plan: Bedrest 3 hours  Robin Donley R. Philip, MD  Pager: [phone]

## 2024-03-24 ENCOUNTER — Encounter (HOSPITAL_COMMUNITY): Payer: Self-pay

## 2024-03-24 LAB — SURGICAL PATHOLOGY

## 2024-04-15 DIAGNOSIS — D631 Anemia in chronic kidney disease: Secondary | ICD-10-CM | POA: Diagnosis not present

## 2024-04-15 DIAGNOSIS — N179 Acute kidney failure, unspecified: Secondary | ICD-10-CM | POA: Diagnosis not present

## 2024-04-15 DIAGNOSIS — N2581 Secondary hyperparathyroidism of renal origin: Secondary | ICD-10-CM | POA: Diagnosis not present

## 2024-04-15 DIAGNOSIS — I129 Hypertensive chronic kidney disease with stage 1 through stage 4 chronic kidney disease, or unspecified chronic kidney disease: Secondary | ICD-10-CM | POA: Diagnosis not present

## 2024-04-15 DIAGNOSIS — N1831 Chronic kidney disease, stage 3a: Secondary | ICD-10-CM | POA: Diagnosis not present

## 2024-05-06 DIAGNOSIS — Z23 Encounter for immunization: Secondary | ICD-10-CM | POA: Diagnosis not present

## 2024-05-06 DIAGNOSIS — I129 Hypertensive chronic kidney disease with stage 1 through stage 4 chronic kidney disease, or unspecified chronic kidney disease: Secondary | ICD-10-CM | POA: Diagnosis not present

## 2024-05-06 DIAGNOSIS — D649 Anemia, unspecified: Secondary | ICD-10-CM | POA: Diagnosis not present

## 2024-05-06 DIAGNOSIS — N184 Chronic kidney disease, stage 4 (severe): Secondary | ICD-10-CM | POA: Diagnosis not present

## 2024-05-06 DIAGNOSIS — F17200 Nicotine dependence, unspecified, uncomplicated: Secondary | ICD-10-CM | POA: Diagnosis not present

## 2024-05-06 DIAGNOSIS — E1165 Type 2 diabetes mellitus with hyperglycemia: Secondary | ICD-10-CM | POA: Diagnosis not present

## 2024-05-06 DIAGNOSIS — E038 Other specified hypothyroidism: Secondary | ICD-10-CM | POA: Diagnosis not present

## 2024-05-06 DIAGNOSIS — E785 Hyperlipidemia, unspecified: Secondary | ICD-10-CM | POA: Diagnosis not present

## 2024-05-06 DIAGNOSIS — Z794 Long term (current) use of insulin: Secondary | ICD-10-CM | POA: Diagnosis not present

## 2024-06-08 ENCOUNTER — Other Ambulatory Visit: Payer: Self-pay | Admitting: Internal Medicine

## 2024-06-08 DIAGNOSIS — Z1231 Encounter for screening mammogram for malignant neoplasm of breast: Secondary | ICD-10-CM

## 2024-06-23 ENCOUNTER — Ambulatory Visit
Admission: RE | Admit: 2024-06-23 | Discharge: 2024-06-23 | Disposition: A | Source: Ambulatory Visit | Attending: Internal Medicine | Admitting: Internal Medicine

## 2024-06-23 DIAGNOSIS — Z1231 Encounter for screening mammogram for malignant neoplasm of breast: Secondary | ICD-10-CM

## 2024-07-07 DIAGNOSIS — N189 Chronic kidney disease, unspecified: Secondary | ICD-10-CM | POA: Diagnosis not present

## 2024-07-07 DIAGNOSIS — E113493 Type 2 diabetes mellitus with severe nonproliferative diabetic retinopathy without macular edema, bilateral: Secondary | ICD-10-CM | POA: Diagnosis not present

## 2024-07-07 DIAGNOSIS — N179 Acute kidney failure, unspecified: Secondary | ICD-10-CM | POA: Diagnosis not present

## 2024-07-07 DIAGNOSIS — H43812 Vitreous degeneration, left eye: Secondary | ICD-10-CM | POA: Diagnosis not present

## 2024-07-07 DIAGNOSIS — H43821 Vitreomacular adhesion, right eye: Secondary | ICD-10-CM | POA: Diagnosis not present

## 2024-07-07 DIAGNOSIS — H35033 Hypertensive retinopathy, bilateral: Secondary | ICD-10-CM | POA: Diagnosis not present

## 2024-07-07 DIAGNOSIS — Z961 Presence of intraocular lens: Secondary | ICD-10-CM | POA: Diagnosis not present

## 2024-07-14 DIAGNOSIS — N184 Chronic kidney disease, stage 4 (severe): Secondary | ICD-10-CM | POA: Diagnosis not present

## 2024-07-14 DIAGNOSIS — N2581 Secondary hyperparathyroidism of renal origin: Secondary | ICD-10-CM | POA: Diagnosis not present

## 2024-07-14 DIAGNOSIS — D631 Anemia in chronic kidney disease: Secondary | ICD-10-CM | POA: Diagnosis not present

## 2024-07-14 DIAGNOSIS — I129 Hypertensive chronic kidney disease with stage 1 through stage 4 chronic kidney disease, or unspecified chronic kidney disease: Secondary | ICD-10-CM | POA: Diagnosis not present
